# Patient Record
Sex: Female | Born: 1935 | Race: White | Hispanic: No | Smoking: Never smoker
Health system: Southern US, Community
[De-identification: ages and names within clinical notes are randomized; demographics above are authoritative.]

## PROBLEM LIST (undated history)

## (undated) DIAGNOSIS — D649 Anemia, unspecified: Secondary | ICD-10-CM

## (undated) DIAGNOSIS — I10 Essential (primary) hypertension: Secondary | ICD-10-CM

## (undated) DIAGNOSIS — I509 Heart failure, unspecified: Secondary | ICD-10-CM

## (undated) DIAGNOSIS — I4891 Unspecified atrial fibrillation: Secondary | ICD-10-CM

## (undated) HISTORY — PX: EYE SURGERY: SHX253

## (undated) HISTORY — PX: OTHER SURGICAL HISTORY: SHX169

## (undated) HISTORY — PX: FRACTURE SURGERY: SHX138

---

## 2007-06-03 ENCOUNTER — Ambulatory Visit: Payer: Self-pay | Admitting: Cardiology

## 2007-09-10 ENCOUNTER — Ambulatory Visit: Payer: Self-pay | Admitting: Cardiology

## 2011-05-18 ENCOUNTER — Inpatient Hospital Stay (HOSPITAL_COMMUNITY)
Admission: AD | Admit: 2011-05-18 | Discharge: 2011-05-23 | DRG: 481 | Disposition: A | Discharge status: Skilled Nursing Facility | Payer: Medicare Other | Source: Other Acute Inpatient Hospital | Attending: Internal Medicine | Admitting: Internal Medicine

## 2011-05-18 ENCOUNTER — Inpatient Hospital Stay (HOSPITAL_COMMUNITY): Payer: Medicare Other

## 2011-05-18 ENCOUNTER — Encounter (HOSPITAL_COMMUNITY): Payer: Self-pay

## 2011-05-18 DIAGNOSIS — W19XXXA Unspecified fall, initial encounter: Secondary | ICD-10-CM | POA: Diagnosis present

## 2011-05-18 DIAGNOSIS — S72143A Displaced intertrochanteric fracture of unspecified femur, initial encounter for closed fracture: Principal | ICD-10-CM | POA: Diagnosis present

## 2011-05-18 DIAGNOSIS — D649 Anemia, unspecified: Secondary | ICD-10-CM | POA: Clinically undetermined

## 2011-05-18 DIAGNOSIS — I509 Heart failure, unspecified: Secondary | ICD-10-CM | POA: Diagnosis present

## 2011-05-18 DIAGNOSIS — I4891 Unspecified atrial fibrillation: Secondary | ICD-10-CM

## 2011-05-18 DIAGNOSIS — I959 Hypotension, unspecified: Secondary | ICD-10-CM | POA: Diagnosis present

## 2011-05-18 DIAGNOSIS — N189 Chronic kidney disease, unspecified: Secondary | ICD-10-CM | POA: Diagnosis present

## 2011-05-18 DIAGNOSIS — Z7901 Long term (current) use of anticoagulants: Secondary | ICD-10-CM

## 2011-05-18 DIAGNOSIS — N179 Acute kidney failure, unspecified: Secondary | ICD-10-CM

## 2011-05-18 DIAGNOSIS — D62 Acute posthemorrhagic anemia: Secondary | ICD-10-CM | POA: Diagnosis not present

## 2011-05-18 DIAGNOSIS — S72009A Fracture of unspecified part of neck of unspecified femur, initial encounter for closed fracture: Secondary | ICD-10-CM | POA: Diagnosis present

## 2011-05-18 DIAGNOSIS — I129 Hypertensive chronic kidney disease with stage 1 through stage 4 chronic kidney disease, or unspecified chronic kidney disease: Secondary | ICD-10-CM | POA: Diagnosis present

## 2011-05-18 DIAGNOSIS — I10 Essential (primary) hypertension: Secondary | ICD-10-CM | POA: Diagnosis present

## 2011-05-18 HISTORY — DX: Essential (primary) hypertension: I10

## 2011-05-18 HISTORY — DX: Anemia, unspecified: D64.9

## 2011-05-18 HISTORY — DX: Unspecified atrial fibrillation: I48.91

## 2011-05-18 HISTORY — DX: Heart failure, unspecified: I50.9

## 2011-05-18 LAB — COMPREHENSIVE METABOLIC PANEL
AST: 16 U/L (ref 0–37)
BUN: 24 mg/dL — ABNORMAL HIGH (ref 6–23)
CO2: 27 mEq/L (ref 19–32)
Calcium: 9 mg/dL (ref 8.4–10.5)
Chloride: 104 mEq/L (ref 96–112)
Creatinine, Ser: 1.13 mg/dL — ABNORMAL HIGH (ref 0.50–1.10)
GFR calc Af Amer: 45 mL/min — ABNORMAL LOW (ref >90)
GFR calc non Af Amer: 39 mL/min — ABNORMAL LOW (ref >90)
Glucose, Bld: 91 mg/dL (ref 70–99)
Total Bilirubin: 1.2 mg/dL (ref 0.3–1.2)

## 2011-05-18 LAB — CBC
HCT: 31.7 % — ABNORMAL LOW (ref 36.0–46.0)
Hemoglobin: 10.7 g/dL — ABNORMAL LOW (ref 12.0–15.0)
MCHC: 33.8 g/dL (ref 30.0–36.0)
MCV: 98.1 fL (ref 78.0–100.0)

## 2011-05-18 LAB — PHOSPHORUS: Phosphorus: 3.5 mg/dL (ref 2.3–4.6)

## 2011-05-18 LAB — PROTIME-INR: INR: 1.58 — ABNORMAL HIGH (ref 0.00–1.49)

## 2011-05-18 MED ORDER — ACETAMINOPHEN 650 MG RE SUPP: 650.0000 mg | Freq: Four times a day (QID) | RECTAL | Status: DC | PRN

## 2011-05-18 MED ORDER — MORPHINE SULFATE 4 MG/ML IJ SOLN: 0.5000 mg | INTRAMUSCULAR | Status: DC | PRN

## 2011-05-18 MED ORDER — MORPHINE SULFATE 2 MG/ML IJ SOLN: 0.5000 mg | INTRAMUSCULAR | Status: DC | PRN

## 2011-05-18 MED ORDER — HYDROCODONE-ACETAMINOPHEN 5-325 MG PO TABS: 1.0000 | ORAL_TABLET | ORAL | Status: DC | PRN

## 2011-05-18 MED ORDER — ONDANSETRON HCL 4 MG PO TABS: 4.0000 mg | ORAL_TABLET | Freq: Four times a day (QID) | ORAL | Status: DC | PRN

## 2011-05-18 MED ORDER — SENNOSIDES-DOCUSATE SODIUM 8.6-50 MG PO TABS: 1.0000 | ORAL_TABLET | Freq: Every day | ORAL | Status: DC | PRN

## 2011-05-18 MED ORDER — ACETAMINOPHEN 325 MG PO TABS: 650.0000 mg | ORAL_TABLET | Freq: Four times a day (QID) | ORAL | Status: DC | PRN

## 2011-05-18 MED ORDER — HYDROCODONE-ACETAMINOPHEN 5-325 MG PO TABS
1.0000 | ORAL_TABLET | ORAL | Status: DC | PRN
  Administered 2011-05-18: 1 via ORAL
  Filled 2011-05-18: qty 1

## 2011-05-18 MED ORDER — FUROSEMIDE 20 MG PO TABS
20.0000 mg | ORAL_TABLET | Freq: Every day | ORAL | Status: DC
  Administered 2011-05-18: 20 mg via ORAL
  Filled 2011-05-18 (×2): qty 1

## 2011-05-18 MED ORDER — ONDANSETRON HCL 4 MG/2ML IJ SOLN: 4.0000 mg | Freq: Four times a day (QID) | INTRAMUSCULAR | Status: DC | PRN

## 2011-05-18 NOTE — H&P (Addendum)
PCP:  No primary provider on file.   DOA:  05/18/2011  5:52 PM  Chief Complaint:  fall  HPI: Pt presented after fall at home and found to have left hip fracture. Denies chest pain, shortness of breath, no abdominal or urinary concerns.  Allergies: Allergies  Allergen Reactions  . Erythromycin     Reaction and severity  unknown    Prior to Admission medications   Not on File    Past Medical History  Diagnosis Date  . HTN (hypertension) 05/18/2011  . CHF (congestive heart failure) 05/18/2011  . A-fib 05/18/2011    Past Surgical History  Procedure Date  . Eye surgery   . Fracture surgery   . Hx fx lt wrist years ago    Social History:  reports that she has never smoked. She has never used smokeless tobacco. She reports that she does not drink alcohol or use illicit drugs.  No family history on file.  Review of Systems:  Constitutional: Denies fever, chills, diaphoresis, appetite change and fatigue.  HEENT: Denies photophobia, eye pain, redness, hearing loss, ear pain, congestion, sore throat, rhinorrhea, sneezing, mouth sores, trouble swallowing, neck pain, neck stiffness and tinnitus.   Respiratory: Denies SOB, DOE, cough, chest tightness,  and wheezing.   Cardiovascular: Denies chest pain, palpitations and leg swelling.  Gastrointestinal: Denies nausea, vomiting, abdominal pain, diarrhea, constipation, blood in stool and abdominal distention.  Genitourinary: Denies dysuria, urgency, frequency, hematuria, flank pain and difficulty urinating.  Skin: Denies pallor, rash and wound.  Neurological: Denies dizziness, seizures, syncope, weakness, light-headedness, numbness and headaches.  Hematological: Denies adenopathy. Easy bruising, personal or family bleeding history  Psychiatric/Behavioral: Denies suicidal ideation, mood changes, confusion, nervousness, sleep disturbance and agitation   Physical Exam:  Filed Vitals:   05/18/11 1816  BP: 108/67  Pulse: 81    Temp: 98.4 F (36.9 C)  Resp: 20  Height: 5\' 3"  (1.6 m)  Weight: 53.2 kg (117 lb 4.6 oz)  SpO2: 95%    Constitutional: Vital signs reviewed.  Patient is a well-developed and well-nourished in no acute distress and cooperative with exam. Alert and oriented x3.  Head: Normocephalic and atraumatic Ear: TM normal bilaterally Mouth: no erythema or exudates, MMM Eyes: PERRL, EOMI, conjunctivae normal, No scleral icterus.  Neck: Supple, Trachea midline normal ROM, No JVD, mass, thyromegaly, or carotid bruit present.  Cardiovascular: RRR, S1 normal, S2 normal, no MRG, pulses symmetric and intact bilaterally Pulmonary/Chest: CTAB, no wheezes, rales, or rhonchi Abdominal: Soft. Non-tender, non-distended, bowel sounds are normal, no masses, organomegaly, or guarding present.  Ext: no edema and no cyanosis, pulses palpable bilaterally (DP and PT) Hematology: no cervical, inginal, or axillary adenopathy.  Skin: Warm, dry and intact. No rash, cyanosis, or clubbing.  Psychiatric: Normal mood and affect. speech and behavior is normal. Judgment and thought content normal. Cognition and memory are normal.   Labs on Admission:  No results found for this or any previous visit (from the past 48 hour(s)).  Radiological Exams on Admission: No results found.  Assessment/Plan Principal Problem:  *Hip fracture - orthopedic team consulted for further evaluation and management Active Problems:  HTN (hypertension) - continue low dose lasix and reassess in the AM  CHF (congestive heart failure) - home dose is 80 mg BID but will continue 20 mg QD and reassess in the AM  A-fib - pt not on any beta blocker or CCB, she is on coumadin at home but will hold on anticoagulation for now given the need  for likely surgery per ortho   Time Spent on Admission: Over 30 minutes  MAGICK-Tajuan Dufault 05/18/2011, 6:50 PM

## 2011-05-18 NOTE — Progress Notes (Signed)
Donna Saucer, MD went in to see patient.  MD explained surgery to patient.  MD spoke with family member Donna Jones by telephone.  Requested RN to verify consent.   Bartholomew Crews, RN obtained verbal consent as well.  Dahlia Byes RN spoke with patient who verified Donna Jones was one of her two POA's.  She said she was unable to sign the consent (physically).  Dahlia Byes RN spoke with Donna Jones who agreed to bring in [facility] [place] document.

## 2011-05-18 NOTE — Consult Note (Signed)
Reason for Consult left hip pain Referring Physician: Hospitalist physicians as well as [facility] emergency room physicians  Donna Jones is an 77 y.o. female.  HPI: Donna Jones he is a 77 year old embospheres female who walks with a cane she fell today on the day of admission without loss of consciousness and reports left hip pain and now the inability to gain weight she denies any other orthopedic problems. Patient lives in a condominium with [address] care in [place] she has 2 nephews in town to look after her.  Past Medical History  Diagnosis Date  . HTN (hypertension) 05/18/2011  . CHF (congestive heart failure) 05/18/2011  . A-fib 05/18/2011    Past Surgical History  Procedure Date  . Eye surgery   . Fracture surgery   . Hx fx lt wrist years ago    No family history on file.  Social History:  reports that she has never smoked. She has never used smokeless tobacco. She reports that she does not drink alcohol or use illicit drugs.  Allergies:  Allergies  Allergen Reactions  . Erythromycin     Reaction and severity  unknown    Medications: I have reviewed the patient's current medications.  Results for orders placed during the hospital encounter of 05/18/11 (from the past 48 hour(s))  COMPREHENSIVE METABOLIC PANEL     Status: Abnormal   Collection Time   05/18/11  9:00 PM      Component Value Range Comment   Sodium 141  135 - 145 (mEq/L)    Potassium 3.9  3.5 - 5.1 (mEq/L)    Chloride 104  96 - 112 (mEq/L)    CO2 27  19 - 32 (mEq/L)    Glucose, Bld 91  70 - 99 (mg/dL)    BUN 24 (*) 6 - 23 (mg/dL)    Creatinine, Ser 4.78 (*) 0.50 - 1.10 (mg/dL)    Calcium 9.0  8.4 - 10.5 (mg/dL)    Total Protein 5.9 (*) 6.0 - 8.3 (g/dL)    Albumin 3.3 (*) 3.5 - 5.2 (g/dL)    AST 16  0 - 37 (U/L)    ALT 9  0 - 35 (U/L)    Alkaline Phosphatase 55  39 - 117 (U/L)    Total Bilirubin 1.2  0.3 - 1.2 (mg/dL)    GFR calc non Af Amer 39 (*) >90 (mL/min)    GFR calc Af Amer 45 (*) >90 (mL/min)     MAGNESIUM     Status: Normal   Collection Time   05/18/11  9:00 PM      Component Value Range Comment   Magnesium 2.1  1.5 - 2.5 (mg/dL)   PHOSPHORUS     Status: Normal   Collection Time   05/18/11  9:00 PM      Component Value Range Comment   Phosphorus 3.5  2.3 - 4.6 (mg/dL)   APTT     Status: Normal   Collection Time   05/18/11  9:00 PM      Component Value Range Comment   aPTT 30  24 - 37 (seconds)   PROTIME-INR     Status: Abnormal   Collection Time   05/18/11  9:00 PM      Component Value Range Comment   Prothrombin Time 19.2 (*) 11.6 - 15.2 (seconds)    INR 1.58 (*) 0.00 - 1.49    CBC     Status: Abnormal   Collection Time   05/18/11  9:00 PM  Component Value Range Comment   WBC 6.9  4.0 - 10.5 (K/uL)    RBC 3.23 (*) 3.87 - 5.11 (MIL/uL)    Hemoglobin 10.7 (*) 12.0 - 15.0 (g/dL)    HCT 16.1 (*) 09.6 - 46.0 (%)    MCV 98.1  78.0 - 100.0 (fL)    MCH 33.1  26.0 - 34.0 (pg)    MCHC 33.8  30.0 - 36.0 (g/dL)    RDW 04.5  40.9 - 81.1 (%)    Platelets 119 (*) 150 - 400 (K/uL)     No results found.  Review of Systems  Constitutional: Negative.   HENT: Negative.   Eyes: Negative.   Musculoskeletal: Positive for joint pain.  Skin: Positive for rash.  Neurological: Negative.    Blood pressure 97/60, pulse 80, temperature 97.5 F (36.4 C), temperature source Oral, resp. rate 19, height 5\' 3"  (1.6 m), weight 53.2 kg (117 lb 4.6 oz), SpO2 94.00%. Physical Exam on orthopedic examination the patient does have tenderness to palpation on the left hip region she has good dorsiflexion plantarflexion of the feet pedal pulses are palpable sensation is intact in the lower extremities there is no effusion or crepitus in either knee or ankle on the right or left-hand side bilateral upper extremity range of motion is intact extraocular movements are intact the patient answers questions appropriately she has no coarse granular crepitus in the elbow shoulder or wrist joint radial  pulses are intact mentioned patient does have some bruising on the skin she has full range of motion of her neck without pain or tenderness.  Assessment/Plan: Impression: Left intertrochanteric hip fracture outside radiographs are reviewed the patient does have medical comorbidities including atrial fibrillation on Coumadin current INR is 1.5 age as close to being acceptable for surgery. Plan is for intramedullary hip screw fixation the risk and benefits are discussed with the patient in [name] son via phone conversation included but not limited to infection nerve vessel damage incomplete healing as well as the stress of surgery which could give her a cardiovascular event and thrombosis. Hospital stay approximately 2-3 days medical management will be optimized prior to surgery all questions answered regarding the surgery I do plan on I do plan on checking a PT/INR prior to surgery in the morning.  [name] SCOTT 05/18/2011, 11:32 PM

## 2011-05-19 ENCOUNTER — Inpatient Hospital Stay (HOSPITAL_COMMUNITY): Payer: Medicare Other

## 2011-05-19 ENCOUNTER — Encounter (HOSPITAL_COMMUNITY): Payer: Self-pay | Admitting: Internal Medicine

## 2011-05-19 ENCOUNTER — Encounter (HOSPITAL_COMMUNITY): Payer: Self-pay | Admitting: Anesthesiology

## 2011-05-19 ENCOUNTER — Encounter (HOSPITAL_COMMUNITY)
Admission: AD | Disposition: A | Discharge status: Skilled Nursing Facility | Payer: Self-pay | Source: Other Acute Inpatient Hospital | Attending: Internal Medicine

## 2011-05-19 ENCOUNTER — Inpatient Hospital Stay (HOSPITAL_COMMUNITY): Payer: Medicare Other | Admitting: Anesthesiology

## 2011-05-19 ENCOUNTER — Other Ambulatory Visit: Payer: Self-pay

## 2011-05-19 DIAGNOSIS — D649 Anemia, unspecified: Secondary | ICD-10-CM

## 2011-05-19 DIAGNOSIS — N189 Chronic kidney disease, unspecified: Secondary | ICD-10-CM | POA: Diagnosis present

## 2011-05-19 HISTORY — PX: FEMUR IM NAIL: SHX1597

## 2011-05-19 HISTORY — DX: Anemia, unspecified: D64.9

## 2011-05-19 LAB — CBC
HCT: 31.3 % — ABNORMAL LOW (ref 36.0–46.0)
Hemoglobin: 10.5 g/dL — ABNORMAL LOW (ref 12.0–15.0)
MCH: 33.1 pg (ref 26.0–34.0)
MCHC: 33.5 g/dL (ref 30.0–36.0)

## 2011-05-19 LAB — BASIC METABOLIC PANEL
BUN: 24 mg/dL — ABNORMAL HIGH (ref 6–23)
Chloride: 106 mEq/L (ref 96–112)
Creatinine, Ser: 1.2 mg/dL — ABNORMAL HIGH (ref 0.50–1.10)
GFR calc non Af Amer: 36 mL/min — ABNORMAL LOW (ref >90)
Glucose, Bld: 76 mg/dL (ref 70–99)
Potassium: 3.7 mEq/L (ref 3.5–5.1)

## 2011-05-19 LAB — PROTIME-INR: Prothrombin Time: 19.6 seconds — ABNORMAL HIGH (ref 11.6–15.2)

## 2011-05-19 SURGERY — INSERTION, INTRAMEDULLARY ROD, FEMUR
Anesthesia: General | Site: Hip | Laterality: Left | Wound class: Clean

## 2011-05-19 MED ORDER — NEOSTIGMINE METHYLSULFATE 1 MG/ML IJ SOLN
INTRAMUSCULAR | Status: DC | PRN
  Administered 2011-05-19: 3 mg via INTRAVENOUS

## 2011-05-19 MED ORDER — CEFAZOLIN SODIUM 1-5 GM-% IV SOLN
INTRAVENOUS | Status: AC
  Filled 2011-05-19: qty 50

## 2011-05-19 MED ORDER — MAGNESIUM HYDROXIDE 400 MG/5ML PO SUSP: 30.0000 mL | Freq: Two times a day (BID) | ORAL | Status: DC | PRN

## 2011-05-19 MED ORDER — POTASSIUM CHLORIDE CRYS ER 10 MEQ PO TBCR
10.0000 meq | EXTENDED_RELEASE_TABLET | Freq: Every day | ORAL | Status: DC
  Administered 2011-05-19 – 2011-05-23 (×5): 10 meq via ORAL
  Filled 2011-05-19 (×5): qty 1

## 2011-05-19 MED ORDER — GLYCOPYRROLATE 0.2 MG/ML IJ SOLN
INTRAMUSCULAR | Status: DC | PRN
  Administered 2011-05-19: .4 mg via INTRAVENOUS

## 2011-05-19 MED ORDER — MIDAZOLAM HCL 2 MG/2ML IJ SOLN: 0.5000 mg | Freq: Once | INTRAMUSCULAR | Status: DC | PRN

## 2011-05-19 MED ORDER — DOCUSATE SODIUM 100 MG PO CAPS
100.0000 mg | ORAL_CAPSULE | Freq: Two times a day (BID) | ORAL | Status: DC
  Administered 2011-05-19 – 2011-05-23 (×8): 100 mg via ORAL
  Filled 2011-05-19 (×9): qty 1

## 2011-05-19 MED ORDER — SODIUM CHLORIDE 0.9 % IR SOLN
Status: DC | PRN
  Administered 2011-05-19: 1

## 2011-05-19 MED ORDER — BISACODYL 10 MG RE SUPP: 10.0000 mg | Freq: Every day | RECTAL | Status: DC | PRN

## 2011-05-19 MED ORDER — POTASSIUM CHLORIDE IN NACL 20-0.9 MEQ/L-% IV SOLN
INTRAVENOUS | Status: DC
  Administered 2011-05-19: 980 mL via INTRAVENOUS
  Filled 2011-05-19 (×2): qty 1000

## 2011-05-19 MED ORDER — METOCLOPRAMIDE HCL 5 MG/ML IJ SOLN
5.0000 mg | Freq: Three times a day (TID) | INTRAMUSCULAR | Status: DC | PRN
  Filled 2011-05-19: qty 2

## 2011-05-19 MED ORDER — MORPHINE SULFATE 2 MG/ML IJ SOLN: 0.5000 mg | INTRAMUSCULAR | Status: DC | PRN

## 2011-05-19 MED ORDER — POTASSIUM CHLORIDE IN NACL 20-0.9 MEQ/L-% IV SOLN
INTRAVENOUS | Status: DC
  Administered 2011-05-20: 12:00:00 via INTRAVENOUS
  Filled 2011-05-19 (×3): qty 1000

## 2011-05-19 MED ORDER — KCL IN DEXTROSE-NACL 20-5-0.45 MEQ/L-%-% IV SOLN
INTRAVENOUS | Status: DC
  Filled 2011-05-19 (×2): qty 1000

## 2011-05-19 MED ORDER — MEPERIDINE HCL 25 MG/ML IJ SOLN: 6.2500 mg | INTRAMUSCULAR | Status: DC | PRN

## 2011-05-19 MED ORDER — MENTHOL 3 MG MT LOZG
1.0000 | LOZENGE | OROMUCOSAL | Status: DC | PRN
  Filled 2011-05-19: qty 9

## 2011-05-19 MED ORDER — METOCLOPRAMIDE HCL 10 MG PO TABS: 5.0000 mg | ORAL_TABLET | Freq: Three times a day (TID) | ORAL | Status: DC | PRN

## 2011-05-19 MED ORDER — FLEET ENEMA 7-19 GM/118ML RE ENEM
1.0000 | ENEMA | Freq: Every day | RECTAL | Status: DC | PRN
  Filled 2011-05-19: qty 1

## 2011-05-19 MED ORDER — ETOMIDATE 2 MG/ML IV SOLN
INTRAVENOUS | Status: DC | PRN
  Administered 2011-05-19: 14 mg via INTRAVENOUS

## 2011-05-19 MED ORDER — METHOCARBAMOL 500 MG PO TABS
500.0000 mg | ORAL_TABLET | Freq: Four times a day (QID) | ORAL | Status: DC | PRN
  Filled 2011-05-19: qty 1

## 2011-05-19 MED ORDER — PHENOL 1.4 % MT LIQD
1.0000 | OROMUCOSAL | Status: DC | PRN
  Filled 2011-05-19: qty 177

## 2011-05-19 MED ORDER — WARFARIN SODIUM 2.5 MG PO TABS: 2.5000 mg | ORAL_TABLET | Freq: Every day | ORAL | Status: DC

## 2011-05-19 MED ORDER — CEFAZOLIN SODIUM 1-5 GM-% IV SOLN
INTRAVENOUS | Status: DC | PRN
  Administered 2011-05-19: 1 g via INTRAVENOUS

## 2011-05-19 MED ORDER — HYDROMORPHONE HCL PF 1 MG/ML IJ SOLN: 0.2500 mg | INTRAMUSCULAR | Status: DC | PRN

## 2011-05-19 MED ORDER — ACETAMINOPHEN 325 MG PO TABS
650.0000 mg | ORAL_TABLET | Freq: Four times a day (QID) | ORAL | Status: DC | PRN
  Administered 2011-05-22: 650 mg via ORAL
  Filled 2011-05-19: qty 2

## 2011-05-19 MED ORDER — WARFARIN SODIUM 2.5 MG PO TABS
2.5000 mg | ORAL_TABLET | Freq: Once | ORAL | Status: AC
  Administered 2011-05-19: 2.5 mg via ORAL
  Filled 2011-05-19: qty 1

## 2011-05-19 MED ORDER — ACETAMINOPHEN 650 MG RE SUPP: 650.0000 mg | Freq: Four times a day (QID) | RECTAL | Status: DC | PRN

## 2011-05-19 MED ORDER — PROMETHAZINE HCL 25 MG/ML IJ SOLN: 6.2500 mg | INTRAMUSCULAR | Status: DC | PRN

## 2011-05-19 MED ORDER — FUROSEMIDE 80 MG PO TABS
80.0000 mg | ORAL_TABLET | Freq: Two times a day (BID) | ORAL | Status: DC
  Filled 2011-05-19 (×2): qty 1

## 2011-05-19 MED ORDER — METHOCARBAMOL 100 MG/ML IJ SOLN
500.0000 mg | Freq: Four times a day (QID) | INTRAVENOUS | Status: DC | PRN
  Filled 2011-05-19: qty 5

## 2011-05-19 MED ORDER — OXYBUTYNIN CHLORIDE 5 MG PO TABS
5.0000 mg | ORAL_TABLET | Freq: Two times a day (BID) | ORAL | Status: DC
  Administered 2011-05-19 – 2011-05-23 (×8): 5 mg via ORAL
  Filled 2011-05-19 (×9): qty 1

## 2011-05-19 MED ORDER — LACTATED RINGERS IV SOLN: INTRAVENOUS | Status: DC

## 2011-05-19 MED ORDER — HYDROCODONE-ACETAMINOPHEN 5-325 MG PO TABS
1.0000 | ORAL_TABLET | ORAL | Status: DC | PRN
  Administered 2011-05-21 – 2011-05-23 (×2): 1 via ORAL
  Filled 2011-05-19 (×2): qty 1

## 2011-05-19 MED ORDER — BISACODYL 5 MG PO TBEC: 10.0000 mg | DELAYED_RELEASE_TABLET | Freq: Every day | ORAL | Status: DC | PRN

## 2011-05-19 MED ORDER — FENTANYL CITRATE 0.05 MG/ML IJ SOLN
INTRAMUSCULAR | Status: DC | PRN
  Administered 2011-05-19 (×2): 50 ug via INTRAVENOUS

## 2011-05-19 MED ORDER — ROCURONIUM BROMIDE 100 MG/10ML IV SOLN
INTRAVENOUS | Status: DC | PRN
  Administered 2011-05-19: 40 mg via INTRAVENOUS

## 2011-05-19 MED ORDER — LACTATED RINGERS IV SOLN
INTRAVENOUS | Status: DC | PRN
  Administered 2011-05-19: 07:00:00 via INTRAVENOUS

## 2011-05-19 MED ORDER — POLYETHYLENE GLYCOL 3350 17 G PO PACK
17.0000 g | PACK | Freq: Every day | ORAL | Status: DC | PRN
  Filled 2011-05-19: qty 1

## 2011-05-19 MED ORDER — CEFAZOLIN SODIUM 1-5 GM-% IV SOLN
1.0000 g | Freq: Four times a day (QID) | INTRAVENOUS | Status: AC
  Administered 2011-05-19 – 2011-05-20 (×3): 1 g via INTRAVENOUS
  Filled 2011-05-19 (×4): qty 50

## 2011-05-19 SURGICAL SUPPLY — 46 items
BLADE SURG 15 STRL LF DISP TIS (BLADE) ×1 IMPLANT
BLADE SURG 15 STRL SS (BLADE) ×1
BLADE SURG ROTATE 9660 (MISCELLANEOUS) IMPLANT
CLOTH BEACON ORANGE TIMEOUT ST (SAFETY) ×2 IMPLANT
COVER SURGICAL LIGHT HANDLE (MISCELLANEOUS) ×2 IMPLANT
DRAPE ORTHO SPLIT 77X108 STRL (DRAPES)
DRAPE PROXIMA HALF (DRAPES) IMPLANT
DRAPE STERI IOBAN 125X83 (DRAPES) ×2 IMPLANT
DRAPE SURG ORHT 6 SPLT 77X108 (DRAPES) IMPLANT
DRSG MEPILEX BORDER 4X4 (GAUZE/BANDAGES/DRESSINGS) ×6 IMPLANT
DRSG MEPILEX BORDER 4X8 (GAUZE/BANDAGES/DRESSINGS) IMPLANT
DRSG XEROFORM 1X8 (GAUZE/BANDAGES/DRESSINGS) ×4 IMPLANT
DURAPREP 26ML APPLICATOR (WOUND CARE) ×2 IMPLANT
ELECT REM PT RETURN 9FT ADLT (ELECTROSURGICAL) ×2
ELECTRODE REM PT RTRN 9FT ADLT (ELECTROSURGICAL) ×1 IMPLANT
FACESHIELD LNG OPTICON STERILE (SAFETY) IMPLANT
GAUZE XEROFORM 1X8 LF (GAUZE/BANDAGES/DRESSINGS) ×2 IMPLANT
GAUZE XEROFORM 5X9 LF (GAUZE/BANDAGES/DRESSINGS) ×2 IMPLANT
GLOVE BIOGEL PI IND STRL 8 (GLOVE) ×1 IMPLANT
GLOVE BIOGEL PI INDICATOR 8 (GLOVE) ×1
GLOVE SURG ORTHO 8.0 STRL STRW (GLOVE) ×2 IMPLANT
GOWN PREVENTION PLUS LG XLONG (DISPOSABLE) IMPLANT
GOWN PREVENTION PLUS XLARGE (GOWN DISPOSABLE) ×2 IMPLANT
GOWN STRL NON-REIN LRG LVL3 (GOWN DISPOSABLE) ×2 IMPLANT
GUIDE PIN 3.2 LONG (PIN) ×2 IMPLANT
GUIDE PIN 3.2MM (MISCELLANEOUS) ×1
GUIDE PIN ORTH 343X3.2XBRAD (MISCELLANEOUS) ×1 IMPLANT
HIP SCREW SET (Screw) ×2 IMPLANT
KIT BASIN OR (CUSTOM PROCEDURE TRAY) ×2 IMPLANT
KIT ROOM TURNOVER OR (KITS) ×2 IMPLANT
MANIFOLD NEPTUNE II (INSTRUMENTS) ×2 IMPLANT
NAIL IMSH 10X34 (Nail) ×2 IMPLANT
NS IRRIG 1000ML POUR BTL (IV SOLUTION) ×2 IMPLANT
PACK GENERAL/GYN (CUSTOM PROCEDURE TRAY) ×2 IMPLANT
PAD ARMBOARD 7.5X6 YLW CONV (MISCELLANEOUS) ×4 IMPLANT
SCREW COMPRESSION (Screw) ×2 IMPLANT
SCREW LAG 90MM (Screw) ×1 IMPLANT
SCREW LAGSTD 90X21X12.7X9 (Screw) ×1 IMPLANT
SPONGE LAP 4X18 X RAY DECT (DISPOSABLE) IMPLANT
STAPLER VISISTAT 35W (STAPLE) ×2 IMPLANT
SUT ETHILON 2 0 FS 18 (SUTURE) IMPLANT
SUT VIC AB 2-0 CTB1 (SUTURE) ×4 IMPLANT
TAPE STRIPS DRAPE STRL (GAUZE/BANDAGES/DRESSINGS) IMPLANT
TOWEL OR 17X24 6PK STRL BLUE (TOWEL DISPOSABLE) ×2 IMPLANT
TOWEL OR 17X26 10 PK STRL BLUE (TOWEL DISPOSABLE) ×2 IMPLANT
WATER STERILE IRR 1000ML POUR (IV SOLUTION) ×2 IMPLANT

## 2011-05-19 NOTE — Transfer of Care (Signed)
Immediate Anesthesia Transfer of Care Note  Patient: Donna Jones  Procedure(s) Performed:  INTRAMEDULLARY (IM) NAIL FEMORAL  Patient Location: PACU  Anesthesia Type: General  Level of Consciousness: awake and alert   Airway & Oxygen Therapy: Patient Spontanous Breathing and Patient connected to face mask oxygen  Post-op Assessment: Report given to PACU RN  Post vital signs: Reviewed and stable  Complications: No apparent anesthesia complications

## 2011-05-19 NOTE — Progress Notes (Signed)
Subjective: No events overnight. Pt is back from surgery and reports feeling well, tolerated surgery well.  Objective:  Vital signs in last 24 hours:  Filed Vitals:   05/19/11 1030 05/19/11 1031 05/19/11 1032 05/19/11 1033  BP:      Pulse: 52 104 116 103  Temp:      TempSrc:      Resp: 15 16 17 15   Height:      Weight:      SpO2: 98% 98% 99% 98%    Intake/Output from previous day:   Intake/Output Summary (Last 24 hours) at 05/19/11 1244 Last data filed at 05/19/11 1015  Gross per 24 hour  Intake    782 ml  Output    500 ml  Net    282 ml    Physical Exam: General: Alert, awake, oriented x3, in no acute distress. HEENT: No bruits, no goiter. Heart: Irregular rate and rhythm, without murmurs, rubs, gallops. Lungs: Clear to auscultation bilaterally. Abdomen: Soft, nontender, nondistended, positive bowel sounds. Extremities: No clubbing cyanosis or edema with positive pedal pulses. Neuro: Grossly intact, nonfocal.   Lab Results:  Basic Metabolic Panel:    Component Value Date/Time   NA 144 05/19/2011 0540   K 3.7 05/19/2011 0540   CL 106 05/19/2011 0540   CO2 29 05/19/2011 0540   BUN 24* 05/19/2011 0540   CREATININE 1.20* 05/19/2011 0540   GLUCOSE 76 05/19/2011 0540   CALCIUM 8.9 05/19/2011 0540   CBC:    Component Value Date/Time   WBC 6.5 05/19/2011 0540   HGB 10.5* 05/19/2011 0540   HCT 31.3* 05/19/2011 0540   PLT 116* 05/19/2011 0540   MCV 98.7 05/19/2011 0540    No results found for this or any previous visit (from the past 240 hour(s)).  Studies/Results: Dg Chest 2 View  05/19/2011  *RADIOLOGY REPORT*  Clinical Data: Preoperative chest radiograph for left femoral intertrochanteric fracture.  CHEST - 2 VIEW  Comparison: Chest radiograph performed 05/18/2011  Findings: The lungs are well-aerated.  Minimal bilateral atelectasis or scarring is noted.  Mild peripheral nodularity appears stable from prior studies.  There is no evidence of pleural  effusion or pneumothorax.  The heart is mildly enlarged; the patient is status post median sternotomy.  No acute osseous abnormalities are seen.  IMPRESSION:  1.  Minimal bilateral atelectasis or scarring noted; mild chronic lung changes seen. 2.  Mild cardiomegaly.  Original Report Authenticated By: Tonia Ghent, M.D.    Medications: Scheduled Meds:   . DISCONTD: furosemide  20 mg Oral Daily   Continuous Infusions:   . DISCONTD: 0.9 % NaCl with KCl 20 mEq / L 75 mL/hr at 05/19/11 0530  . DISCONTD: dextrose 5 % and 0.45 % NaCl with KCl 20 mEq/L    . DISCONTD: lactated ringers     PRN Meds:.acetaminophen, acetaminophen, HYDROcodone-acetaminophen, methocarbamol(ROBAXIN) IV, methocarbamol, morphine injection, ondansetron (ZOFRAN) IV, ondansetron, senna-docusate, DISCONTD: HYDROcodone-acetaminophen, DISCONTD: HYDROmorphone, DISCONTD: meperidine, DISCONTD: midazolam, DISCONTD: midazolam, DISCONTD: morphine, DISCONTD:  morphine injection, DISCONTD: promethazine, DISCONTD: sodium chloride  Assessment/Plan:  Principal Problem:  *Hip fracture, intertrochanteric - per orthopedic team, pt has tolerated surgery well  Active Problems:  HTN (hypertension) - currently well controlled  CHF (congestive heart failure) - clinically compensated. We have temporarily held lasix prior to surgery and now since the is ? New elevation in creatining I will continue to hold it for another 24 hours since pt also appears slightly hypovolemic on physical exam. Please note that pt is on Lasix  80 mg BID at home.   A-fib - currently rate controlled, resume coumadin   Renal failure (ARF), acute on chronic - follow strict I's and O's, obtain BMP in the AM   Anemia - unclear what the pt's baseline is, this could be related to post op blood loss but for now will repeat CBC in AM    LOS: 1 day   Donna Jones 05/19/2011, 12:44 PM

## 2011-05-19 NOTE — Progress Notes (Signed)
05/19/11  1050 pt returned from PACU via hospital bed, awake, alert, oriented to self.  Family here, allowed to visit, place back on tele # 24 with a fib (chronic). PAS hose applied, cont pulse ox applied with 95% sats on 2lpm.  IV flds with K+ restarted to continue this bag and d/c as long as taking flds and no nausea, call MD as needed regarding this.  Dsg left upper leg dry and intact, ice bag in place.  Pt states no pain.  VS stable.

## 2011-05-19 NOTE — Brief Op Note (Signed)
05/18/2011 - 05/19/2011  9:20 AM  PATIENT:  Donna Jones  77 y.o. female  PRE-OPERATIVE DIAGNOSIS:  Left hip fracture  POST-OPERATIVE DIAGNOSIS: left hip fracture*  PROCEDURE:  Procedure(s): INTRAMEDULLARY (IM) NAIL FEMORAL  SURGEON:  Surgeon(s): Cammy Copa  ASSISTANT:   ANESTHESIA:   general  EBL: 50 ml    Total I/O In: 682 [I.V.:350; Blood:332] Out: 50 [Blood:50]  BLOOD ADMINISTERED: none  DRAINS: none   LOCAL MEDICATIONS USED:  none  SPECIMEN:  No Specimen  COUNTS:  YES  TOURNIQUET:  * No tourniquets in log *  DICTATION: .Other Dictation: Dictation Number dictated in system  PLAN OF CARE: Admit to inpatient   PATIENT DISPOSITION:  PACU - hemodynamically stable

## 2011-05-19 NOTE — Anesthesia Procedure Notes (Signed)
Procedure Name: Intubation Date/Time: 05/19/2011 8:13 AM Performed by: Glendora Score Pre-anesthesia Checklist: Patient identified, Emergency Drugs available, Suction available and Patient being monitored Patient Re-evaluated:Patient Re-evaluated prior to inductionOxygen Delivery Method: Circle System Utilized Preoxygenation: Pre-oxygenation with 100% oxygen Intubation Type: IV induction Ventilation: Mask ventilation without difficulty Laryngoscope Size: Miller and 2 Grade View: Grade I Tube type: Oral Tube size: 7.5 mm Placement Confirmation: ETT inserted through vocal cords under direct vision,  positive ETCO2 and breath sounds checked- equal and bilateral Secured at: 21 cm Tube secured with: Tape Dental Injury: Teeth and Oropharynx as per pre-operative assessment

## 2011-05-19 NOTE — Op Note (Signed)
NAMEJANACE, DECKER NO.:  [id number]  MEDICAL RECORD NO.:  [id number]  LOCATION:  [facility]                         FACILITY:  [facility]  PHYSICIAN:  Burnard Bunting, M.D.    DATE OF BIRTH:  June 02, 1911  DATE OF PROCEDURE:  05/19/2011 DATE OF DISCHARGE:                              OPERATIVE REPORT   PREOPERATIVE DIAGNOSIS:  Left hip intertrochanteric fracture.  POSTOPERATIVE DIAGNOSIS:  Left hip intertrochanteric fracture.  PROCEDURE:  Left hip intertrochanteric fracture, open reduction and internal fixation with Smith and Nephew, left IMHS 10 x 34 with 90 mm lag screw.  SURGEON:  Burnard Bunting, MD  ASSISTANT:  None.  ANESTHESIA:  General endotracheal.  ESTIMATED BLOOD LOSS:  50 mL.  DRAINS:  None.  INDICATIONS:  Caliegh Middlekauff is an ambulatory 77 year old female with left hip pain and fracture following a fall.  She presents now for operative management of her hip fracture after explanation risks and benefits.  PROCEDURE IN DETAIL:  The patient was brought to the operating room where general endotracheal anesthesia was induced.  Preoperative antibiotics were administered.  Time-out was called.  FFP was administered for INR of 1.6.  Ioban wall drape was used to cover the operative field after it was prepped with alcohol and Betadine which was allowed to air dry and then prepped with DuraPrep solution.  Topographical anatomy was marked including aversion of the femur and the femoral neck and location of the trochanter.  Incision was made 4 fingerbreadths proximal to the trochanteric tip. Guide pin was inserted. Mid portion of the trochanter confirmed the AP and lateral planes under fluoroscopy.  Proximal reaming was performed 10 x 34 nail in accordance with preoperative thin plating was placed.  Through a separate incision, a 90 mm lag screw was placed followed by compression screw.  This gave excellent fixation of the fracture.  Lag screw was placed in the  middle inferior portion of the neck in the most high quality bone.  Good fixation was achieved.  Both incisions were then thoroughly irrigated and closed using a 0 Vicryl suture, 2-0 Vicryl suture, and skin staples.  The patient was then extubated.  She tolerated the procedure well without immediate complications.     Burnard Bunting, M.D.     GSD/MEDQ  D:  05/19/2011  T:  05/19/2011  Job:  [id number]

## 2011-05-19 NOTE — Anesthesia Postprocedure Evaluation (Signed)
  Anesthesia Post-op Note  Patient: Donna Jones  Procedure(s) Performed:  INTRAMEDULLARY (IM) NAIL FEMORAL  Patient Location: PACU  Anesthesia Type: General  Level of Consciousness: awake  Airway and Oxygen Therapy: Patient Spontanous Breathing  Post-op Pain: mild  Post-op Assessment: Post-op Vital signs reviewed  Post-op Vital Signs: stable  Complications: No apparent anesthesia complications

## 2011-05-19 NOTE — Anesthesia Preprocedure Evaluation (Addendum)
Anesthesia Evaluation  Patient identified by MRN, date of birth, ID band Patient awake    Reviewed: Allergy & Precautions, H&P , NPO status , Patient's Chart, lab work & pertinent test results  History of Anesthesia Complications Negative for: history of anesthetic complications  Airway Mallampati: II TM Distance: <3 FB Neck ROM: Full    Dental  (+) Teeth Intact and Dental Advisory Given   Pulmonary  clear to auscultation        Cardiovascular Exercise Tolerance: Poor hypertension, Pt. on medications + CAD and +CHF Irregular Abnormal AFib; chronic anticoagulation.   Neuro/Psych Short term memory issues    GI/Hepatic   Endo/Other    Renal/GU      Musculoskeletal   Abdominal   Peds  Hematology Last dose coumadin 05/17/11. INR 1.6. Platelets 116000.   Anesthesia Other Findings   Reproductive/Obstetrics                          Anesthesia Physical Anesthesia Plan  ASA: III  Anesthesia Plan: General   Post-op Pain Management:    Induction: Intravenous  Airway Management Planned: Oral ETT  Additional Equipment:   Intra-op Plan:   Post-operative Plan: Possible Post-op intubation/ventilation  Informed Consent: I have reviewed the patients History and Physical, chart, labs and discussed the procedure including the risks, benefits and alternatives for the proposed anesthesia with the patient or authorized representative who has indicated his/her understanding and acceptance.   Dental advisory given  Plan Discussed with:   Anesthesia Plan Comments:         Anesthesia Quick Evaluation

## 2011-05-19 NOTE — Progress Notes (Signed)
ANTICOAGULATION CONSULT NOTE - Initial Consult  Pharmacy Consult for Coumadin Indication: atrial fibrillation, DVT prophylaxis s/p hip ORIF  Allergies  Allergen Reactions  . Erythromycin     Reaction and severity  unknown    Patient Measurements: Height: 5\' 3"  (160 cm) Weight: 117 lb 4.6 oz (53.2 kg) IBW/kg (Calculated) : 52.4   Vital Signs: Temp: 97.6 F (36.4 C) (11/11 1015) Temp src: Oral (11/11 0505) BP: 100/67 mmHg (11/11 1028) Pulse Rate: 103  (11/11 1033)  Labs:  [facility] 05/19/11 0540 05/18/11 2100  HGB 10.5* 10.7*  HCT 31.3* 31.7*  PLT 116* 119*  APTT -- 30  LABPROT 19.6* 19.2*  INR 1.63* 1.58*  HEPARINUNFRC -- --  CREATININE 1.20* 1.13*  CKTOTAL -- --  CKMB -- --  TROPONINI -- --   Estimated Creatinine Clearance: 21.7 ml/min (by C-G formula based on Cr of 1.2).  Medical History: Past Medical History  Diagnosis Date  . HTN (hypertension) 05/18/2011  . CHF (congestive heart failure) 05/18/2011  . A-fib 05/18/2011  . Anemia 05/19/2011    Medications:  Prescriptions prior to admission  Medication Sig Dispense Refill  . furosemide (LASIX) 80 MG tablet Take 80 mg by mouth 2 (two) times daily.        Marland Kitchen oxybutynin (DITROPAN) 5 MG tablet Take 5 mg by mouth 2 (two) times daily.        . potassium chloride (KLOR-CON) 10 MEQ CR tablet Take 10 mEq by mouth 2 (two) times daily.        Marland Kitchen warfarin (COUMADIN) 2.5 MG tablet Take 2.5 mg by mouth at bedtime.          Assessment: Afib, s/p hip IM Nailing: to restart anticoagulation with Coumadin.  Note her INR was subtherapeutic on admit but given postoperative state, anemia, and advanced age I will not increase her initial dosage.  Goal of Therapy:  INR 2-3   Plan:  Coumadin 2.5mg  tonight. Daily PT/INR.  Madolyn Frieze 05/19/2011,1:00 PM

## 2011-05-19 NOTE — Preoperative (Signed)
Beta Blockers   Reason not to administer Beta Blockers:Not Applicable 

## 2011-05-19 NOTE — Progress Notes (Signed)
05/19/11 0155 Contacted Rise Paganini, MD regarding clarification of pre op orders.  Pre op orders continuous pulse ox, steward sedation, neuro checks, ice and D5 1/2 NS with 20K discontinued per verbal on telephone pre operative.

## 2011-05-20 LAB — PREPARE FRESH FROZEN PLASMA: Unit division: 0

## 2011-05-20 LAB — PROTIME-INR: Prothrombin Time: 21.9 seconds — ABNORMAL HIGH (ref 11.6–15.2)

## 2011-05-20 MED ORDER — SIMVASTATIN 20 MG PO TABS
20.0000 mg | ORAL_TABLET | Freq: Every day | ORAL | Status: DC
  Administered 2011-05-20 – 2011-05-22 (×3): 20 mg via ORAL
  Filled 2011-05-20 (×4): qty 1

## 2011-05-20 MED ORDER — LIP MEDEX EX OINT
TOPICAL_OINTMENT | CUTANEOUS | Status: DC | PRN
  Administered 2011-05-20: 1 via TOPICAL
  Filled 2011-05-20: qty 7

## 2011-05-20 MED ORDER — HYPROMELLOSE (GONIOSCOPIC) 2.5 % OP SOLN
1.0000 [drp] | Freq: Three times a day (TID) | OPHTHALMIC | Status: DC
  Filled 2011-05-20: qty 15

## 2011-05-20 MED ORDER — SODIUM CHLORIDE 0.9 % IV SOLN
INTRAVENOUS | Status: DC
  Administered 2011-05-20: 16:00:00 via INTRAVENOUS

## 2011-05-20 MED ORDER — FUROSEMIDE 40 MG PO TABS
40.0000 mg | ORAL_TABLET | Freq: Two times a day (BID) | ORAL | Status: DC
  Administered 2011-05-20 – 2011-05-23 (×6): 40 mg via ORAL
  Filled 2011-05-20 (×9): qty 1

## 2011-05-20 MED ORDER — POLYVINYL ALCOHOL 1.4 % OP SOLN
1.0000 [drp] | Freq: Three times a day (TID) | OPHTHALMIC | Status: DC
  Administered 2011-05-20 – 2011-05-23 (×8): 1 [drp] via OPHTHALMIC
  Filled 2011-05-20 (×2): qty 15

## 2011-05-20 MED ORDER — WARFARIN SODIUM 2.5 MG PO TABS
2.5000 mg | ORAL_TABLET | Freq: Once | ORAL | Status: AC
  Administered 2011-05-20: 2.5 mg via ORAL
  Filled 2011-05-20: qty 1

## 2011-05-20 NOTE — Progress Notes (Cosign Needed)
Subjective: No events overnight.   Objective:  Vital signs in last 24 hours:  Filed Vitals:   05/19/11 1425 05/19/11 2128 05/20/11 0548 05/20/11 0550  BP: 91/56 87/56 97/60    Pulse: 94 113 96   Temp: 97.6 F (36.4 C) 100.3 F (37.9 C) 98.5 F (36.9 C)   TempSrc: Oral     Resp: 18 20 20    Height:      Weight:      SpO2: 100% 97% 91% 95%    Intake/Output from previous day:  Intake/Output Summary (Last 24 hours) at 05/20/11 1002 Last data filed at 05/20/11 0906  Gross per 24 hour  Intake   1400 ml  Output    900 ml  Net    500 ml    Physical Exam: [name] is awake and alert she appears frail and elderly. Head is atraumatic normocephalic. Chest demonstrates no accessory muscle use no wheezes or crackles to my exam Heart has an irregularly irregular rhythm patient has chronic atrial fibrillation. I hear no murmurs rubs or gallops Abdomen has active bowel sounds soft nontender nondistended Lower extremities demonstrate multiple bruises, she has no distal extremity swelling  Lab Results:  Basic Metabolic Panel:    Component Value Date/Time   NA 144 05/19/2011 0540   K 3.7 05/19/2011 0540   CL 106 05/19/2011 0540   CO2 29 05/19/2011 0540   BUN 24* 05/19/2011 0540   CREATININE 1.20* 05/19/2011 0540   GLUCOSE 76 05/19/2011 0540   CALCIUM 8.9 05/19/2011 0540   CBC:    Component Value Date/Time   WBC 6.5 05/19/2011 0540   HGB 10.5* 05/19/2011 0540   HCT 31.3* 05/19/2011 0540   PLT 116* 05/19/2011 0540   MCV 98.7 05/19/2011 0540    No results found for this or any previous visit (from the past 240 hour(s)).  Studies/Results: Dg Chest 2 View  05/19/2011  *RADIOLOGY REPORT*  Clinical Data: Preoperative chest radiograph for left femoral intertrochanteric fracture.  CHEST - 2 VIEW  Comparison: Chest radiograph performed 05/18/2011  Findings: The lungs are well-aerated.  Minimal bilateral atelectasis or scarring is noted.  Mild peripheral nodularity  appears stable from prior studies.  There is no evidence of pleural effusion or pneumothorax.  The heart is mildly enlarged; the patient is status post median sternotomy.  No acute osseous abnormalities are seen.  IMPRESSION:  1.  Minimal bilateral atelectasis or scarring noted; mild chronic lung changes seen. 2.  Mild cardiomegaly.  Original Report Authenticated By: Tonia Ghent, M.D.   Dg Femur Left  05/19/2011  *RADIOLOGY REPORT*  Clinical Data: Left hip fracture.  LEFT FEMUR - 2 VIEW  Comparison: Radiographs dated 05/18/2011  Findings: AP and lateral C-arm images demonstrate insertion of an intramedullary rod in the femoral shaft and a compression screw across the intertrochanteric fracture of the proximal left femur. Position of the fragments is anatomic.  IMPRESSION: Satisfactory appearance of the left hip after open reduction and internal fixation of intertrochanteric fracture.  Original Report Authenticated By: Gwynn Burly, M.D.   Dg C-arm 61-120 Min  05/19/2011  CLINICAL DATA: fracture left femur   C-ARM 61-120 MINUTES  Fluoroscopy was utilized by the requesting physician.  No radiographic  interpretation.      Medications: Scheduled Meds:   . ceFAZolin (ANCEF) IV  1 g Intravenous Q6H  . docusate sodium  100 mg Oral BID  . furosemide  80 mg Oral BID  . oxybutynin  5 mg Oral BID  .  potassium chloride  10 mEq Oral Daily  . warfarin  2.5 mg Oral ONCE-1800  . DISCONTD: warfarin  2.5 mg Oral QHS   Continuous Infusions:   . 0.9 % NaCl with KCl 20 mEq / L    . DISCONTD: 0.9 % NaCl with KCl 20 mEq / L 75 mL/hr at 05/19/11 0530  . DISCONTD: lactated ringers     PRN Meds:.acetaminophen, acetaminophen, bisacodyl, bisacodyl, HYDROcodone-acetaminophen, lip balm, magnesium hydroxide, menthol-cetylpyridinium, metoCLOPramide (REGLAN) injection, metoCLOPramide, morphine injection, ondansetron (ZOFRAN) IV, ondansetron, phenol, polyethylene glycol, senna-docusate, sodium phosphate, DISCONTD:  acetaminophen, DISCONTD: acetaminophen, DISCONTD: HYDROcodone-acetaminophen, DISCONTD: HYDROmorphone, DISCONTD: meperidine DISCONTD: methocarbamol(ROBAXIN) IV, DISCONTD: methocarbamol, DISCONTD: midazolam, DISCONTD: midazolam, DISCONTD: promethazine  Assessment/Plan:  Principal Problem:  *Hip fracture, intertrochanteric - S/P Surgery  Managed by ortho.  PT working with patient today.    Active Problems:  HTN (hypertension) - currently hypotensive.  Not on BP meds.  Will decrease lasix, and add Tylenol to help reduce narcotic use.  CHF (congestive heart failure) - stable on lasix.  A-fib - chronic, rate controlled.  Renal failure (ARF), acute on chronic - appears stable creatinine at 1.2  Anemia - will monitor closely post op.  Patient has many bruises.    LOS: 2 days   YORK,Kaulana Brindle L 05/20/2011, 10:02 AM

## 2011-05-20 NOTE — Progress Notes (Signed)
Subjective: Pt l hip feels better   Objective: Vital signs in last 24 hours: Temp:  [97.6 F (36.4 C)-100.3 F (37.9 C)] 98.5 F (36.9 C) (11/12 0548) Pulse Rate:  [94-113] 100  (11/12 1000) Resp:  [18-20] 20  (11/12 0548) BP: (87-97)/(56-60) 97/60 mmHg (11/12 0548) SpO2:  [91 %-100 %] 97 % (11/12 1000)  Intake/Output from previous day: 11/11 0701 - 11/12 0700 In: 1912 [P.O.:180; I.V.:1350; Blood:332; IV Piggyback:50] Out: 950 [Urine:900; Blood:50] Intake/Output this shift: Total I/O In: 170 [P.O.:120; IV Piggyback:50] Out: -   Exam:  Sensation intact distally Dorsiflexion/Plantar flexion intact Incision: no drainage  Labs:  [facility] 05/19/11 0540 05/18/11 2100  HGB 10.5* 10.7*    Basename 05/19/11 0540 05/18/11 2100  WBC 6.5 6.9  RBC 3.17* 3.23*  HCT 31.3* 31.7*  PLT 116* 119*    Basename 05/19/11 0540 05/18/11 2100  NA 144 141  K 3.7 3.9  CL 106 104  CO2 29 27  BUN 24* 24*  CREATININE 1.20* 1.13*  GLUCOSE 76 91  CALCIUM 8.9 9.0    Basename 05/19/11 0540 05/18/11 2100  LABPT -- --  INR 1.63* 1.58*    Assessment/Plan: Mobilize with pt - ready for transfer to snf from ortho perspective will need f/u 10 days   Donna Jones 05/20/2011, 12:40 PM

## 2011-05-20 NOTE — Progress Notes (Signed)
Clinical social work completed patient psychosocial assessment, see pt shadow chart. Pt is open to short term rehab at skilled nursing facility with the hopes of returning home where 24 hour private care is available. Patient requested clinical social worker to contact patient niece to assist with dc plans to skilled nursing. Pt family requested [facility] or [facility] for to pt to dc to when medically stable. Clinical social worker initiated skilled nursing search, see placement note in pt shadow chart.    Catha Gosselin, [place]   [phone]   17:52

## 2011-05-20 NOTE — Progress Notes (Signed)
05/18/11  @ 1800  Pt arrived by [facility] from [facility], taken there by family (nephew & his wife, pt does not have any other family).  Alert, oriented, IV NSL rt a/c. Bruising, thin skin typical of Coumadin therapy with larger bruise to left elbow & skin tear to left knee.  TH admitting MD has called ortho surgeon for consult, will see pt tonight with possible surgery in am.  Orientation to room/dept, safety video viewed.  Full Code, waiting on additional orders.  Bonney Leitz RN

## 2011-05-20 NOTE — Progress Notes (Signed)
Physical Therapy Evaluation Patient Details Name: Donna Jones MRN: [id number] DOB: 12-20-1910 Today's Date: 05/20/2011  Problem List:  Patient Active Problem List  Diagnoses  . Hip fracture  . HTN (hypertension)  . CHF (congestive heart failure)  . A-fib  . Hip fracture, intertrochanteric  . Renal failure (ARF), acute on chronic  . Anemia    Past Medical History:  Past Medical History  Diagnosis Date  . HTN (hypertension) 05/18/2011  . CHF (congestive heart failure) 05/18/2011  . A-fib 05/18/2011  . Anemia 05/19/2011   Past Surgical History:  Past Surgical History  Procedure Date  . Eye surgery   . Fracture surgery   . Hx fx lt wrist years ago    PT Assessment/Plan/Recommendation PT Assessment Clinical Impression Statement: Pt very weak and deconditioned, requires +2 total assistance and is only able to contribute ~ 30-40%. Pt struggles with PWB precautions and with remembering posterior hip precautions and should be reminded prior to any transfer and with bed mobility. Discussed D/C planning with pt and family, all are in agreement for SNF for rehab.  PT Recommendation/Assessment: Patient will need skilled PT in the acute care venue PT Problem List: Decreased strength;Decreased activity tolerance;Decreased mobility;Decreased cognition;Decreased knowledge of use of DME;Decreased knowledge of precautions;Decreased balance PT Therapy Diagnosis : Difficulty walking;Abnormality of gait;Generalized weakness;Acute pain PT Plan PT Frequency: Min 3X/week PT Treatment/Interventions: DME instruction;Gait training;Stair training;Functional mobility training;Therapeutic exercise PT Recommendation Recommendations for Other Services:  (none) Follow Up Recommendations: Skilled nursing facility Equipment Recommended: Defer to next venue PT Goals  Acute Rehab PT Goals PT Goal Formulation: With patient Time For Goal Achievement: 2 weeks Pt will go Supine/Side to Sit: with min  assist (While following hip precautions) PT Goal: Supine/Side to Sit - Progress: Progressing toward goal Pt will go Sit to Supine/Side: with min assist (following precautions) PT Goal: Sit to Supine/Side - Progress: Progressing toward goal Pt will Transfer Sit to Stand/Stand to Sit: with mod assist PT Transfer Goal: Sit to Stand/Stand to Sit - Progress: Progressing toward goal Pt will Transfer Bed to Chair/Chair to Bed: with mod assist PT Transfer Goal: Bed to Chair/Chair to Bed - Progress: Progressing toward goal  PT Evaluation Precautions/Restrictions  Restrictions Weight Bearing Restrictions: Yes RUE Weight Bearing: Partial weight bearing LLE Weight Bearing: Partial weight bearing Prior Functioning  Home Living Lives With: Other (Comment) (24/7 care) Receives Help From:  (2 nephews and nieces - help prn) Type of Home: Apartment Home Layout: One level Home Access: Stairs to enter Entrance Stairs-Rails: Right Entrance Stairs-Number of Steps: 1 Bathroom Shower/Tub: Walk-in shower (some days sponge bathes) Bathroom Toilet: Handicapped height (riser with handles) Home Adaptive Equipment: Bedside commode/3-in-1;Walker - rolling;Shower chair without back (lift chair) Prior Function Level of Independence: Needs assistance with ADLs Bath: Minimal Dressing: Minimal Driving: No Vocation: Retired Producer, television/film/video: Lethargic (minimally) Overall Cognitive Status: Impaired Memory: Decreased recall of precautions Memory Deficits: decreased processing Orientation Level: Oriented to person;Oriented to place Sensation/Coordination Sensation Light Touch: Appears Intact Extremity Assessment RLE Assessment RLE Assessment: Exceptions to [facility] RLE Strength RLE Overall Strength: Deficits RLE Overall Strength Comments: Generalized deconditioning, grossly >/= -3/5 LLE Assessment LLE Assessment: Exceptions to [facility] LLE Strength LLE Overall Strength Comments: Generalized  deconditioning, grossly >/= 2/5 Mobility (including Balance) Bed Mobility Bed Mobility: Yes Rolling Right: 4: Min assist Rolling Right Details (indicate cue type and reason): Tactile cues to reach for rail, unable to complete full roll.  Supine to Sit: 1: +2 Total assist Supine to Sit  Details (indicate cue type and reason): (Pt = 40%) Verbal and tactile cues for sequence and to maintain precautions.  Transfers Transfers: Yes Stand Pivot Transfers: 1: +2 Total assist Stand Pivot Transfer Details (indicate cue type and reason): (pt= 35%) Verbal cues for PWB precautions, facilitation of hip extension through hips, assistance with RW negotiation and to pivot to chair.  Ambulation/Gait Ambulation/Gait: No Stairs: No Wheelchair Mobility Wheelchair Mobility: No  Posture/Postural Control Posture/Postural Control: No significant limitations Balance Balance Assessed: Yes Static Sitting Balance Static Sitting - Level of Assistance: 5: Stand by assistance Exercise    End of Session PT - End of Session Equipment Utilized During Treatment: Gait belt Activity Tolerance: Patient limited by fatigue Patient left: in chair;with call bell in reach;with family/visitor present Nurse Communication: Mobility status for transfers;Mobility status for ambulation;Need for lift equipment;Weight bearing status (Posterior Hip Precautions) General Behavior During Session: [facility] for tasks performed Cognition: [facility] for tasks performed  Cheek, Dahlia Client Carleene Mains, Iaan Oregel) 05/20/2011, 10:36 AM

## 2011-05-20 NOTE — Progress Notes (Signed)
ANTICOAGULATION CONSULT NOTE   Pharmacy Consult for Coumadin Indication: atrial fibrillation, DVT prophylaxis s/p hip ORIF  Allergies  Allergen Reactions  . Erythromycin     Reaction and severity  unknown    Patient Measurements: Height: 5\' 3"  (160 cm) Weight: 117 lb 4.6 oz (53.2 kg) IBW/kg (Calculated) : 52.4   Vital Signs: Temp: 98.5 F (36.9 C) (11/12 0548) BP: 97/60 mmHg (11/12 0548) Pulse Rate: 100  (11/12 1000)  Labs:  Basename 05/20/11 1153 05/19/11 0540 05/18/11 2100  HGB -- 10.5* 10.7*  HCT -- 31.3* 31.7*  PLT -- 116* 119*  APTT -- -- 30  LABPROT 21.9* 19.6* 19.2*  INR 1.88* 1.63* 1.58*  HEPARINUNFRC -- -- --  CREATININE -- 1.20* 1.13*  CKTOTAL -- -- --  CKMB -- -- --  TROPONINI -- -- --   Estimated Creatinine Clearance: 21.7 ml/min (by C-G formula based on Cr of 1.2).  Medical History: Past Medical History  Diagnosis Date  . HTN (hypertension) 05/18/2011  . CHF (congestive heart failure) 05/18/2011  . A-fib 05/18/2011  . Anemia 05/19/2011    Medications:  Prescriptions prior to admission  Medication Sig Dispense Refill  . CALCIUM PO Take 1 tablet by mouth daily.        . furosemide (LASIX) 80 MG tablet Take 80 mg by mouth 2 (two) times daily.        . hydroxypropyl methylcellulose (ISOPTO TEARS) 2.5 % ophthalmic solution Place 1 drop into both eyes 4 (four) times daily as needed. Dry eyes       . potassium chloride (KLOR-CON) 10 MEQ CR tablet Take 20 mEq by mouth daily.        . simvastatin (ZOCOR) 20 MG tablet Take 20 mg by mouth at bedtime.        Marland Kitchen warfarin (COUMADIN) 2.5 MG tablet Take 2.5 mg by mouth at bedtime.        Marland Kitchen oxybutynin (DITROPAN) 5 MG tablet Take 5 mg by mouth 2 (two) times daily.          Assessment: Afib, s/p hip IM Nailing:  Restarting anticoagulation with Coumadin, this is 2nd day of restart.  INR=1.88.  Goal of Therapy:  INR 2-3   Plan:  Coumadin 2.5mg  tonight. Daily PT/INR.  Marylouise Stacks 05/20/2011,2:23  PM

## 2011-05-20 NOTE — Progress Notes (Signed)
Utilization Review Completed.Izac Faulkenberry T11/06/2011   

## 2011-05-21 LAB — CBC
MCH: 33.5 pg (ref 26.0–34.0)
MCHC: 33 g/dL (ref 30.0–36.0)
Platelets: 85 10*3/uL — ABNORMAL LOW (ref 150–400)
RBC: 2.57 MIL/uL — ABNORMAL LOW (ref 3.87–5.11)

## 2011-05-21 LAB — BASIC METABOLIC PANEL
CO2: 25 mEq/L (ref 19–32)
Calcium: 8.6 mg/dL (ref 8.4–10.5)
GFR calc non Af Amer: 44 mL/min — ABNORMAL LOW (ref >90)
Potassium: 4.3 mEq/L (ref 3.5–5.1)
Sodium: 142 mEq/L (ref 135–145)

## 2011-05-21 LAB — PROTIME-INR
INR: 2.35 — ABNORMAL HIGH (ref 0.00–1.49)
Prothrombin Time: 26.1 seconds — ABNORMAL HIGH (ref 11.6–15.2)

## 2011-05-21 MED ORDER — FENTANYL CITRATE 0.05 MG/ML IJ SOLN: 50.0000 ug | INTRAMUSCULAR | Status: DC | PRN

## 2011-05-21 MED ORDER — WARFARIN 1.25 MG HALF TABLET
1.2500 mg | ORAL_TABLET | Freq: Once | ORAL | Status: AC
  Administered 2011-05-21: 1.25 mg via ORAL
  Filled 2011-05-21: qty 1

## 2011-05-21 NOTE — Progress Notes (Signed)
Clinical Social Worker spoke with patient HCPOA regarding patient bed offers. Patient HCPOA confirmed skilled nursing choice at [facility]. Patient HCPOA will complete admission paper work at [facility] on 05/22/2011 at 9am for patient anticipated discharge to skilled nursing 05/22/2011. Clinical Social Worker continuing to follow to assist with patient discharge plans.    Doree Albee  [phone]  05/21/2011 16:47

## 2011-05-21 NOTE — Progress Notes (Signed)
Physical Therapy Treatment Patient Details Name: Donna Jones MRN: [id number] DOB: 06-Oct-1910 Today's Date: 05/21/2011  PT Assessment/Plan  PT - Assessment/Plan Comments on Treatment Session: Pt with decreased ability to place UEs on RW in standing. Difficulty with transfers however is progressing with ability to contribute.  PT Plan: Discharge plan remains appropriate PT Frequency: Min 3X/week Follow Up Recommendations: Skilled nursing facility Equipment Recommended: Defer to next venue PT Goals  Acute Rehab PT Goals PT Goal Formulation: With patient Time For Goal Achievement: 2 weeks Pt will go Supine/Side to Sit: with min assist (While following hip precautions) PT Goal: Supine/Side to Sit - Progress: Progressing toward goal Pt will go Sit to Supine/Side: with min assist (following precautions) PT Goal: Sit to Supine/Side - Progress: Progressing toward goal Pt will Transfer Sit to Stand/Stand to Sit: with mod assist PT Transfer Goal: Sit to Stand/Stand to Sit - Progress: Progressing toward goal Pt will Transfer Bed to Chair/Chair to Bed: with mod assist PT Transfer Goal: Bed to Chair/Chair to Bed - Progress: Progressing toward goal  PT Treatment Precautions/Restrictions  Restrictions Weight Bearing Restrictions: Yes RUE Weight Bearing: Partial weight bearing RLE Weight Bearing: Non weight bearing LLE Weight Bearing: Partial weight bearing Mobility (including Balance) Bed Mobility Bed Mobility: Yes Supine to Sit: 1: +2 Total assist Supine to Sit Details (indicate cue type and reason): (pt = 40%)  Verbal and tactile cues for sequence and to maintain precautions, encourage contribution through UEs Sitting - Scoot to Edge of Bed: 3: Mod assist Sitting - Scoot to Edge of Bed Details (indicate cue type and reason): PT use of pad to advance and position pelvis, pt with decr. ability to perform Transfers Transfers: Yes Sit to Stand: 1: +2 Total assist;From bed;From  chair/3-in-1 Sit to Stand Details (indicate cue type and reason): (Pt = 35%) Verbal and tactile cues to maintain PWB precautions for LLE, use of UEs Stand to Sit: 1: +2 Total assist;To chair/3-in-1 Stand to Sit Details: (Pt = 35%) Verbal and tactile cues to maintain PWB precautions for LLE, use of UEs Stand Pivot Transfers: 1: +2 Total assist Stand Pivot Transfer Details (indicate cue type and reason): (pt= 40%) Verbal cues for PWB precautions, facilitation of hip extension through pelvis assistance with RW negotiation  Ambulation/Gait Ambulation/Gait: No Stairs: No Wheelchair Mobility Wheelchair Mobility: No  Posture/Postural Control Posture/Postural Control: No significant limitations Balance Balance Assessed: Yes Static Sitting Balance Static Sitting - Balance Support: Bilateral upper extremity supported;Feet supported Static Sitting - Level of Assistance: 5: Stand by assistance Exercise    End of Session PT - End of Session Equipment Utilized During Treatment: Gait belt Activity Tolerance: Patient limited by fatigue Patient left: in chair;with call bell in reach Nurse Communication: Mobility status for transfers;Mobility status for ambulation;Need for lift equipment;Weight bearing status (Posterior Hip Precautions) General Behavior During Session: Lethargic Cognition: WFL for tasks performed (however pt with some confusion)  Ellin Saba) [phone] 05/21/2011, 11:58 AM

## 2011-05-21 NOTE — Progress Notes (Signed)
Occupational Therapy Evaluation Patient Details Name: Donna Jones MRN: [id number] DOB: 05-23-1911 Today's Date: 05/21/2011  Noted pt needing total A for mobilty and is planning on SNF.  Will defer OT eval to SNF.  [place], [place] [phone]

## 2011-05-21 NOTE — Progress Notes (Signed)
Subjective:  Objective: Filed Vitals:   05/21/11 0845 05/21/11 0920 05/21/11 0930 05/21/11 1436  BP: 110/67   90/55  Pulse:    99  Temp:    98.6 F (37 C)  TempSrc:    Oral  Resp:    20  Height:      Weight:      SpO2: 99% 92% 97% 100%   Weight change:   Intake/Output Summary (Last 24 hours) at 05/21/11 1932 Last data filed at 05/21/11 1437  Gross per 24 hour  Intake    702 ml  Output    700 ml  Net      2 ml    General: Alert, awake, oriented x3, in no acute distress.  HEENT: [place]/AT PEERL, EOMI Neck: Trachea midline,  no masses, no thyromegal,y no JVD, no carotid bruit OROPHARYNX:  Moist, No exudate/ erythema/lesions.  Heart: Regular rate and rhythm, without murmurs, rubs, gallops, PMI non-displaced, no heaves or thrills on palpation.  Lungs: Clear to auscultation, no wheezing or rhonchi noted. No increased vocal fremitus resonant to percussion  Abdomen: Soft, nontender, nondistended, positive bowel sounds, no masses no hepatosplenomegaly noted..  Neuro: No focal neurological deficits noted cranial nerves II through XII grossly intact. DTRs 2+ bilaterally upper and lower extremities. Strength 5 out of 5 in bilateral upper and lower extremities. Musculoskeletal: No warm swelling or erythema around joints, no spinal tenderness noted. Psychiatric: Patient alert and oriented x3, good insight and cognition, good recent to remote recall. Lymph node survey: No cervical axillary or inguinal lymphadenopathy noted.     Lab Results:  Basename 05/21/11 0630 05/19/11 0540 05/18/11 2100  NA 142 144 --  K 4.3 3.7 --  CL 110 106 --  CO2 25 29 --  GLUCOSE 112* 76 --  BUN 21 24* --  CREATININE 1.03 1.20* --  CALCIUM 8.6 8.9 --  MG -- -- 2.1  PHOS -- -- 3.5    Basename 05/18/11 2100  AST 16  ALT 9  ALKPHOS 55  BILITOT 1.2  PROT 5.9*  ALBUMIN 3.3*   No results found for this basename: LIPASE:2,AMYLASE:2 in the last 72 hours  Basename 05/21/11 0630 05/19/11 0540  WBC 8.4  6.5  NEUTROABS -- --  HGB 8.6* 10.5*  HCT 26.1* 31.3*  MCV 101.6* 98.7  PLT 85* 116*   No results found for this basename: CKTOTAL:3,CKMB:3,CKMBINDEX:3,TROPONINI:3 in the last 72 hours No results found for this basename: POCBNP:3 in the last 72 hours No results found for this basename: DDIMER:2 in the last 72 hours No results found for this basename: HGBA1C:2 in the last 72 hours No results found for this basename: CHOL:2,HDL:2,LDLCALC:2,TRIG:2,CHOLHDL:2,LDLDIRECT:2 in the last 72 hours  Basename 05/18/11 2100  TSH 2.556  T4TOTAL --  T3FREE --  THYROIDAB --   No results found for this basename: VITAMINB12:2,FOLATE:2,FERRITIN:2,TIBC:2,IRON:2,RETICCTPCT:2 in the last 72 hours  Micro Results: No results found for this or any previous visit (from the past 240 hour(s)).  Studies/Results: Dg Chest 2 View  05/19/2011  *RADIOLOGY REPORT*  Clinical Data: Preoperative chest radiograph for left femoral intertrochanteric fracture.  CHEST - 2 VIEW  Comparison: Chest radiograph performed 05/18/2011  Findings: The lungs are well-aerated.  Minimal bilateral atelectasis or scarring is noted.  Mild peripheral nodularity appears stable from prior studies.  There is no evidence of pleural effusion or pneumothorax.  The heart is mildly enlarged; the patient is status post median sternotomy.  No acute osseous abnormalities are seen.  IMPRESSION:  1.  Minimal bilateral atelectasis or scarring noted; mild chronic lung changes seen. 2.  Mild cardiomegaly.  Original Report Authenticated By: Tonia Ghent, M.D.   Dg Femur Left  05/19/2011  *RADIOLOGY REPORT*  Clinical Data: Left hip fracture.  LEFT FEMUR - 2 VIEW  Comparison: Radiographs dated 05/18/2011  Findings: AP and lateral C-arm images demonstrate insertion of an intramedullary rod in the femoral shaft and a compression screw across the intertrochanteric fracture of the proximal left femur. Position of the fragments is anatomic.  IMPRESSION: Satisfactory  appearance of the left hip after open reduction and internal fixation of intertrochanteric fracture.  Original Report Authenticated By: Gwynn Burly, M.D.   Dg C-arm 61-120 Min  05/19/2011  CLINICAL DATA: fracture left femur   C-ARM 61-120 MINUTES  Fluoroscopy was utilized by the requesting physician.  No radiographic  interpretation.      Medications: I have reviewed the patient's current medications. Scheduled Meds:   . docusate sodium  100 mg Oral BID  . furosemide  40 mg Oral BID  . oxybutynin  5 mg Oral BID  . polyvinyl alcohol  1 drop Both Eyes TID  . potassium chloride  10 mEq Oral Daily  . simvastatin  20 mg Oral q1800  . warfarin  1.25 mg Oral ONCE-1800   Continuous Infusions:   . DISCONTD: sodium chloride 75 mL/hr at 05/20/11 1545   PRN Meds:.acetaminophen, acetaminophen, bisacodyl, bisacodyl, HYDROcodone-acetaminophen, lip balm, magnesium hydroxide, menthol-cetylpyridinium, metoCLOPramide (REGLAN) injection, metoCLOPramide, morphine injection, ondansetron (ZOFRAN) IV, ondansetron, phenol, polyethylene glycol, senna-docusate, sodium phosphate Assessment/Plan: Patient Active Hospital Problem List: Hip fracture, intertrochanteric (05/18/2011)   Assessment: Patient is therapeutic on Coumadin at this time. Stable for discharge from orthopedic services prospective. Awaiting staff disposition.  CHF (congestive heart failure) (05/18/2011)   Assessment: Type of CHF undocumented on this admission. However the patient is clinically compensated and stable.    Plan continue Lasix.  A-fib (05/18/2011)   Assessment: Heart rate controlled. Patient therapeutic on Coumadin.  Anemia (05/19/2011)   Assessment: Patient has had a drop in hemoglobin the from 10.5-8.6. There is no evidence of active bleeding we'll continue to monitor May need transfusion prior to discharge.   not yet medically stable for discharge   LOS: 3 days

## 2011-05-21 NOTE — Progress Notes (Signed)
Subjective: Pt feels better   Objective: Vital signs in last 24 hours: Temp:  [97.5 F (36.4 C)-98.6 F (37 C)] 98.6 F (37 C) (11/13 1436) Pulse Rate:  [78-99] 99  (11/13 1436) Resp:  [18-20] 20  (11/13 1436) BP: (86-110)/(55-67) 90/55 mmHg (11/13 1436) SpO2:  [92 %-100 %] 100 % (11/13 1436)  Intake/Output from previous day: 11/12 0701 - 11/13 0700 In: 1530 [P.O.:580; I.V.:900; IV Piggyback:50] Out: 1050 [Urine:1050] Intake/Output this shift: Total I/O In: 582 [P.O.:582] Out: -   Exam:  Neurovascular intact Sensation intact distally Intact pulses distally Dorsiflexion/Plantar flexion intact  Labs:  Basename 05/21/11 0630 05/19/11 0540 05/18/11 2100  HGB 8.6* 10.5* 10.7*    Basename 05/21/11 0630 05/19/11 0540  WBC 8.4 6.5  RBC 2.57* 3.17*  HCT 26.1* 31.3*  PLT 85* 116*    Basename 05/21/11 0630 05/19/11 0540  NA 142 144  K 4.3 3.7  CL 110 106  CO2 25 29  BUN 21 24*  CREATININE 1.03 1.20*  GLUCOSE 112* 76  CALCIUM 8.6 8.9    Basename 05/21/11 0630 05/20/11 1153  LABPT -- --  INR 2.35* 1.88*    Assessment/Plan: Pt stable - ready for transfer to snf - inr therapeutic - hl iv - dc foley - case worker to work on [employer] - fu with me 10 days   DEAN,GREGORY SCOTT 05/21/2011, 3:11 PM

## 2011-05-21 NOTE — Progress Notes (Signed)
ANTICOAGULATION CONSULT NOTE - Follow Up Consult  Pharmacy Consult for Warfarin Indication: Afib and VTE px s/p hip ORIF  Allergies  Allergen Reactions  . Erythromycin     Reaction and severity  unknown    Patient Measurements: Height: 5\' 3"  (160 cm) Weight: 117 lb 4.6 oz (53.2 kg) IBW/kg (Calculated) : 52.4    Vital Signs: Temp: 97.7 F (36.5 C) (11/13 0500) Temp src: Oral (11/13 0500) BP: 110/67 mmHg (11/13 0845) Pulse Rate: 99  (11/13 0500)  Labs:  Basename 05/21/11 0630 05/20/11 1153 05/19/11 0540 05/18/11 2100  HGB 8.6* -- 10.5* --  HCT 26.1* -- 31.3* 31.7*  PLT 85* -- 116* 119*  APTT -- -- -- 30  LABPROT 26.1* 21.9* 19.6* --  INR 2.35* 1.88* 1.63* --  HEPARINUNFRC -- -- -- --  CREATININE 1.03 -- 1.20* 1.13*  CKTOTAL -- -- -- --  CKMB -- -- -- --  TROPONINI -- -- -- --   Estimated Creatinine Clearance: 25.2 ml/min (by C-G formula based on Cr of 1.03).   Medications:  Prescriptions prior to admission  Medication Sig Dispense Refill  . CALCIUM PO Take 1 tablet by mouth daily.        . furosemide (LASIX) 80 MG tablet Take 80 mg by mouth 2 (two) times daily.        . hydroxypropyl methylcellulose (ISOPTO TEARS) 2.5 % ophthalmic solution Place 1 drop into both eyes 4 (four) times daily as needed. Dry eyes      . potassium chloride (KLOR-CON) 10 MEQ CR tablet Take 20 mEq by mouth daily.        . simvastatin (ZOCOR) 20 MG tablet Take 20 mg by mouth at bedtime.        Marland Kitchen warfarin (COUMADIN) 2.5 MG tablet Take 2.5 mg by mouth at bedtime.        Marland Kitchen oxybutynin (DITROPAN) 5 MG tablet Take 5 mg by mouth 2 (two) times daily.         Scheduled:    . docusate sodium  100 mg Oral BID  . furosemide  40 mg Oral BID  . oxybutynin  5 mg Oral BID  . polyvinyl alcohol  1 drop Both Eyes TID  . potassium chloride  10 mEq Oral Daily  . simvastatin  20 mg Oral q1800  . warfarin  2.5 mg Oral ONCE-1800  . DISCONTD: hydroxypropyl methylcellulose  1 drop Both Eyes TID     Assessment: [name] on warfarin for hx Afib and for VTE px s/p IM hip nailing with a therapeutic INR this morning. INR trending up quickly (INR 2.35 <--1.88 <--1.63). Given patient's age and recent surgery (could have altered eating habits, etc.), will decrease today's dose to keep within range. Patient has received her home dose of 2.5 mg for the past 2 days. Hgb/Hct dropped since surgery however no signs/symptoms of bleeding noted in chart.  Goal of Therapy:  INR 2-3   Plan:  1. Warfarin 1.25 mg x 1 dose at 1800 today 2. Will f/u PT/INR in the a.m.  Rolley Sims 05/21/2011,2:25 PM

## 2011-05-21 NOTE — Clinical Documentation Improvement (Signed)
CHF DOCUMENTATION CLARIFICATION QUERY  Please update your documentation within the medical record to reflect your response to this query.                                                                                     05/21/11  Dear Dr. Judie Petit. [facility],  In a better effort to capture your patient's severity of illness, reflect appropriate length of stay and utilization of resources, a review of the patient medical record has revealed the following indicators the diagnosis of Heart Failure.    Based on your clinical judgment, please clarify and document in a progress note and/or discharge summary the clinical condition associated with the following supporting information:  In responding to this query please exercise your independent judgment.  The fact that a query is asked, does not imply that any particular answer is desired or expected.  Per MD note on 11/11 " compensated chf" documented please clarify type of CHF if known   (required in order to capture data) .  Thank you   Possible Clinical Conditions?  Chronic Systolic Congestive Heart Failure  Chronic Diastolic Congestive Heart Failure  Chronic Systolic & Diastolic Congestive Heart Failure  Other Condition________________________________________  Cannot Clinically Determine  Supporting Information:  Risk Factors: s/p ORIF lt hip, h/o chf, acute on chronic renal failure   Diagnostics: chest xray 11/10 : Minimal bilateral atelectasis or scarring noted; mild chronic  lung changes seen. 2. Mild cardiomegaly.  Treatment: Lasix 80mg  po bid on hold at this time  Reviewed:per d/c summary unable to clarify systolic or diastolic chf/ only acuity "compensated"  Thank You,  Sincerely, Leonette Most Marjory Meints  Clinical Documentation Specialist RN BSN:  Pager  [phone]  Health Information Management [place]

## 2011-05-22 ENCOUNTER — Encounter (HOSPITAL_COMMUNITY): Payer: Self-pay | Admitting: Orthopedic Surgery

## 2011-05-22 LAB — DIFFERENTIAL
Basophils Absolute: 0 10*3/uL (ref 0.0–0.1)
Basophils Relative: 0 % (ref 0–1)
Eosinophils Absolute: 0.3 10*3/uL (ref 0.0–0.7)
Eosinophils Relative: 4 % (ref 0–5)
Neutrophils Relative %: 73 % (ref 43–77)

## 2011-05-22 LAB — PREPARE RBC (CROSSMATCH)

## 2011-05-22 LAB — PROTIME-INR
INR: 2.31 — ABNORMAL HIGH (ref 0.00–1.49)
Prothrombin Time: 25.8 seconds — ABNORMAL HIGH (ref 11.6–15.2)

## 2011-05-22 LAB — CBC
MCHC: 33.1 g/dL (ref 30.0–36.0)
RDW: 13.7 % (ref 11.5–15.5)
WBC: 7.1 10*3/uL (ref 4.0–10.5)

## 2011-05-22 MED ORDER — WARFARIN SODIUM 2 MG PO TABS
2.0000 mg | ORAL_TABLET | Freq: Once | ORAL | Status: AC
  Administered 2011-05-22: 2 mg via ORAL
  Filled 2011-05-22: qty 1

## 2011-05-22 MED ORDER — SODIUM CHLORIDE 0.9 % IV SOLN
INTRAVENOUS | Status: DC
  Administered 2011-05-22: 01:00:00 via INTRAVENOUS

## 2011-05-22 NOTE — Progress Notes (Signed)
Pt voided an amount of 100 ml at 0615. Bladder scan was not needed. Delrae Sawyers

## 2011-05-22 NOTE — Progress Notes (Signed)
Subjective: Patient's at the bedside. Informed and the patient was getting 1 unit of packed red blood cells in transfusion today. The patient herself had no complaints. Although she says that she's eating well the family reports that her eating has been poor for some time. Objective: Filed Vitals:   05/22/11 0000 05/22/11 0359 05/22/11 0457 05/22/11 0930  BP:   104/69 102/66  Pulse:   90 103  Temp:   98.1 F (36.7 C) 99.2 F (37.3 C)  TempSrc:   Oral Oral  Resp: 18 18 17 18   Height:      Weight:      SpO2: 98% 98% 94% 96%   Weight change:   Intake/Output Summary (Last 24 hours) at 05/22/11 1506 Last data filed at 05/22/11 1220  Gross per 24 hour  Intake   1075 ml  Output    790 ml  Net    285 ml    General: Alert, awake, oriented x3, in no acute distress.  HEENT: [place]/AT [place]ER[place], EOMI Neck: Trachea midline,  no masses, no thyromegal,y no JVD, no carotid bruit OROPHARYNX:  Moist, No exudate/ erythema/lesions.  Heart: Regular rate and rhythm, without murmurs, rubs, gallops, PMI non-displaced, no heaves or thrills on palpation.  Lungs: Clear to auscultation, no wheezing or rhonchi noted. No increased vocal fremitus resonant to percussion  Abdomen: Soft, nontender, nondistended, positive bowel sounds, no masses no hepatosplenomegaly noted..  Neuro: No focal neurological deficits noted cranial nerves II through XII grossly intact. DTRs 2+ bilaterally upper and lower extremities. Strength 3/5 in bilateral upper and lower extremities. Musculoskeletal: No warm swelling or erythema around joints, no spinal tenderness noted. Psychiatric: Patient alert and oriented x3, good insight and cognition, good recent to remote recall. Lymph node survey: No cervical axillary or inguinal lymphadenopathy noted.     Lab Results:  Basename 05/21/11 0630  NA 142  K 4.3  CL 110  CO2 25  GLUCOSE 112*  BUN 21  CREATININE 1.03  CALCIUM 8.6  MG --  PHOS --   No results found for this basename:  AST:2,ALT:2,ALKPHOS:2,BILITOT:2,PROT:2,ALBUMIN:2 in the last 72 hours No results found for this basename: LIPASE:2,AMYLASE:2 in the last 72 hours  Basename 05/22/11 0558 05/21/11 0630  WBC 7.1 8.4  NEUTROABS 5.1 --  HGB 8.1* 8.6*  HCT 24.5* 26.1*  MCV 100.4* 101.6*  PLT 98* 85*   No results found for this basename: CKTOTAL:3,CKMB:3,CKMBINDEX:3,TROPONINI:3 in the last 72 hours No results found for this basename: POCBNP:3 in the last 72 hours No results found for this basename: DDIMER:2 in the last 72 hours No results found for this basename: HGBA1C:2 in the last 72 hours No results found for this basename: CHOL:2,HDL:2,LDLCALC:2,TRIG:2,CHOLHDL:2,LDLDIRECT:2 in the last 72 hours No results found for this basename: TSH,T4TOTAL,FREET3,T3FREE,THYROIDAB in the last 72 hours No results found for this basename: VITAMINB12:2,FOLATE:2,FERRITIN:2,TIBC:2,IRON:2,RETICCTPCT:2 in the last 72 hours  Micro Results: No results found for this or any previous visit (from the past 240 hour(s)).  Studies/Results: Dg Chest 2 View  05/19/2011  *RADIOLOGY REPORT*  Clinical Data: Preoperative chest radiograph for left femoral intertrochanteric fracture.  CHEST - 2 VIEW  Comparison: Chest radiograph performed 05/18/2011  Findings: The lungs are well-aerated.  Minimal bilateral atelectasis or scarring is noted.  Mild peripheral nodularity appears stable from prior studies.  There is no evidence of pleural effusion or pneumothorax.  The heart is mildly enlarged; the patient is status post median sternotomy.  No acute osseous abnormalities are seen.  IMPRESSION:  1.  Minimal  bilateral atelectasis or scarring noted; mild chronic lung changes seen. 2.  Mild cardiomegaly.  Original Report Authenticated By: Tonia Ghent, M.D.   Dg Femur Left  05/19/2011  *RADIOLOGY REPORT*  Clinical Data: Left hip fracture.  LEFT FEMUR - 2 VIEW  Comparison: Radiographs dated 05/18/2011  Findings: AP and lateral C-arm images demonstrate  insertion of an intramedullary rod in the femoral shaft and a compression screw across the intertrochanteric fracture of the proximal left femur. Position of the fragments is anatomic.  IMPRESSION: Satisfactory appearance of the left hip after open reduction and internal fixation of intertrochanteric fracture.  Original Report Authenticated By: Gwynn Burly, M.D.   Dg C-arm 61-120 Min  05/19/2011  CLINICAL DATA: fracture left femur   C-ARM 61-120 MINUTES  Fluoroscopy was utilized by the requesting physician.  No radiographic  interpretation.      Medications: I have reviewed the patient's current medications. Scheduled Meds:   . docusate sodium  100 mg Oral BID  . furosemide  40 mg Oral BID  . oxybutynin  5 mg Oral BID  . polyvinyl alcohol  1 drop Both Eyes TID  . potassium chloride  10 mEq Oral Daily  . simvastatin  20 mg Oral q1800  . warfarin  1.25 mg Oral ONCE-1800  . warfarin  2 mg Oral ONCE-1800   Continuous Infusions:   . sodium chloride 50 mL/hr at 05/22/11 1100  . DISCONTD: sodium chloride 75 mL/hr at 05/20/11 1545   PRN Meds:.acetaminophen, acetaminophen, bisacodyl, bisacodyl, fentaNYL, HYDROcodone-acetaminophen, lip balm, magnesium hydroxide, menthol-cetylpyridinium, metoCLOPramide (REGLAN) injection, metoCLOPramide, morphine injection, ondansetron (ZOFRAN) IV, ondansetron, phenol, polyethylene glycol, senna-docusate, sodium phosphate Assessment/Plan: Patient Active Hospital Problem List: Hip fracture, intertrochanteric (05/18/2011)   Assessment: Stable to begin skilled therapy for an orthopedic standpoint. The patient a postoperative Coumadin therapeutic INR 0.31.  Hypotension, (05/18/2011)   Assessment: Patient and family report the patient does have chronic hypotension for several years with blood pressures systolic running between the high 80s to 90s. The patient has been asymptomatic with systolic blood pressures in this range. Presently the patient has no complaints  of orthostasis with movement.    CHF (congestive heart failure) (05/18/2011)   Assessment: Not assessed on this admission. Unclear as to whether this is systolic or diastolic. Patient clinically compensated.   A-fib (05/18/2011)   Assessment: Patient continues in a rhythm of atrial fibrillation heart rate is controlled.  Anemia (05/19/2011)   Assessment: On admission to the hospital the patient had a hemoglobin of 10.7. The patient has trended down to hemoglobin of 8.1 postsurgically. The patient shows no active signs of bleeding however given the patient's overall medical condition I think it is valuable to the patient for transfusion of one unit of packed red blood cells. I discussed this with her nephews to have a power of attorney and we'll transfuse 1 unit of PRBCs today.    Anticipated patient we were ready to discharge to the skilled nursing facility on tomorrow barring any complications.   LOS: 4 days

## 2011-05-22 NOTE — Progress Notes (Signed)
Clinical social worker continuing to follow to assist with patient discharge plans to skilled nursing for short term rehab when patient medically stable. Patient anticipated discharge date was delayed due to hemoglobin levels. Patient, patient  family, and HCPOA, and nursing facility informed. Patient plans to dc to [facility] nursing center when medically stable.   Catha Gosselin, LCSWA   .05/22/2011 10:57am

## 2011-05-22 NOTE — Progress Notes (Signed)
ANTICOAGULATION CONSULT NOTE - Follow Up Consult  Pharmacy Consult for Warfarin Indication: Afib and VTE px s/p hip ORIF  Allergies  Allergen Reactions  . Erythromycin     Reaction and severity  unknown    Patient Measurements: Height: 5\' 3"  (160 cm) Weight: 117 lb 4.6 oz (53.2 kg) IBW/kg (Calculated) : 52.4    Vital Signs: Temp: 98.1 F (36.7 C) (11/14 0457) Temp src: Oral (11/14 0457) BP: 104/69 mmHg (11/14 0457) Pulse Rate: 90  (11/14 0457)  Labs:  Basename 05/22/11 0558 05/21/11 0630 05/20/11 1153  HGB 8.1* 8.6* --  HCT 24.5* 26.1* --  PLT 98* 85* --  APTT -- -- --  LABPROT 25.8* 26.1* 21.9*  INR 2.31* 2.35* 1.88*  HEPARINUNFRC -- -- --  CREATININE -- 1.03 --  CKTOTAL -- -- --  CKMB -- -- --  TROPONINI -- -- --   Estimated Creatinine Clearance: 25.2 ml/min (by C-G formula based on Cr of 1.03).   Medications:  Prescriptions prior to admission  Medication Sig Dispense Refill  . CALCIUM PO Take 1 tablet by mouth daily.        . furosemide (LASIX) 80 MG tablet Take 80 mg by mouth 2 (two) times daily.        . hydroxypropyl methylcellulose (ISOPTO TEARS) 2.5 % ophthalmic solution Place 1 drop into both eyes 4 (four) times daily as needed. Dry eyes      . potassium chloride (KLOR-CON) 10 MEQ CR tablet Take 20 mEq by mouth daily.        . simvastatin (ZOCOR) 20 MG tablet Take 20 mg by mouth at bedtime.        Marland Kitchen warfarin (COUMADIN) 2.5 MG tablet Take 2.5 mg by mouth at bedtime.        Marland Kitchen oxybutynin (DITROPAN) 5 MG tablet Take 5 mg by mouth 2 (two) times daily.         Scheduled:     . docusate sodium  100 mg Oral BID  . furosemide  40 mg Oral BID  . oxybutynin  5 mg Oral BID  . polyvinyl alcohol  1 drop Both Eyes TID  . potassium chloride  10 mEq Oral Daily  . simvastatin  20 mg Oral q1800  . warfarin  1.25 mg Oral ONCE-1800    Assessment: [name] on warfarin for hx Afib and for VTE px s/p IM hip nailing with a therapeutic INR this morning. INR = 2.31.    Hgb/Hct dropped since surgery indicating some post-op anemia, thrombocytopenia improving and no noted signs/symptoms of bleeding.  Home dose is 2.5mg  daily, however she received 1.25mg  yesterday and was still within goal this am.  Will give only 2 mg today and f/u INR tomorrow.  Hopefully, can get her back on her home regimen soon.  Goal of Therapy:  INR 2-3   Plan:  1. Warfarin 2 mg x 1 dose at 1800 today 2. Will f/u PT/INR in the a.m.  Letitia Libra, Christos Mixson Faye 05/22/2011,9:18 AM

## 2011-05-22 NOTE — Progress Notes (Signed)
Patient's foley per report from morning nurse was removed at 1830. At 0030 11/14 patient has still not voided. Bladder scanned showed that patient only had 153 cc of urine in bladder. Patient has had only about 30 cc of water intake when taking PM medicines. Provider on call notified. Order to place patient on fluids was given. RN to recheck bladder scan at 0600 if patient has not voided and to reinsert foley if it is evident by bladder scan that patient is retaining urine. Will continue to monitor. Donna Jones

## 2011-05-23 ENCOUNTER — Inpatient Hospital Stay (HOSPITAL_COMMUNITY): Payer: Medicare Other

## 2011-05-23 LAB — TYPE AND SCREEN
ABO/RH(D): A POS
Antibody Screen: NEGATIVE
Unit division: 0

## 2011-05-23 LAB — DIFFERENTIAL
Basophils Relative: 0 % (ref 0–1)
Eosinophils Absolute: 0.2 10*3/uL (ref 0.0–0.7)
Eosinophils Relative: 3 % (ref 0–5)
Lymphs Abs: 0.9 10*3/uL (ref 0.7–4.0)

## 2011-05-23 LAB — CBC
MCH: 32.7 pg (ref 26.0–34.0)
MCHC: 33.7 g/dL (ref 30.0–36.0)
MCV: 96.9 fL (ref 78.0–100.0)
Platelets: 118 10*3/uL — ABNORMAL LOW (ref 150–400)
RBC: 2.94 MIL/uL — ABNORMAL LOW (ref 3.87–5.11)
RDW: 15.3 % (ref 11.5–15.5)

## 2011-05-23 MED ORDER — WARFARIN SODIUM 2.5 MG PO TABS
2.5000 mg | ORAL_TABLET | Freq: Once | ORAL | Status: DC
  Filled 2011-05-23: qty 1

## 2011-05-23 MED ORDER — HYDROCODONE-ACETAMINOPHEN 5-325 MG PO TABS: 1.0000 | ORAL_TABLET | Freq: Three times a day (TID) | ORAL | Status: AC | PRN

## 2011-05-23 MED ORDER — FUROSEMIDE 40 MG PO TABS: 40.0000 mg | ORAL_TABLET | Freq: Two times a day (BID) | ORAL | Status: AC

## 2011-05-23 MED ORDER — POLYETHYLENE GLYCOL 3350 17 G PO PACK: 17.0000 g | PACK | Freq: Every day | ORAL | Status: AC | PRN

## 2011-05-23 MED ORDER — BISACODYL 5 MG PO TBEC: 10.0000 mg | DELAYED_RELEASE_TABLET | Freq: Every day | ORAL | Status: AC | PRN

## 2011-05-23 MED ORDER — ACETAMINOPHEN 325 MG PO TABS: 650.0000 mg | ORAL_TABLET | Freq: Four times a day (QID) | ORAL | Status: AC | PRN

## 2011-05-23 NOTE — Progress Notes (Signed)
ANTICOAGULATION CONSULT NOTE - Follow Up Consult  Pharmacy Consult for Warfarin Indication: Afib and VTE px s/p hip ORIF  Allergies  Allergen Reactions  . Erythromycin     Reaction and severity  unknown    Patient Measurements: Height: 5\' 3"  (160 cm) Weight: 117 lb 4.6 oz (53.2 kg) IBW/kg (Calculated) : 52.4    Vital Signs: Temp: 97.4 F (36.3 C) (11/15 0300) Temp src: Oral (11/15 0300) BP: 123/88 mmHg (11/15 0300) Pulse Rate: 93  (11/14 2220)  Labs:  Basename 05/23/11 0700 05/22/11 0558 05/21/11 0630  HGB 9.6* 8.1* --  HCT 28.5* 24.5* 26.1*  PLT 118* 98* 85*  APTT -- -- --  LABPROT 26.2* 25.8* 26.1*  INR 2.36* 2.31* 2.35*  HEPARINUNFRC -- -- --  CREATININE -- -- 1.03  CKTOTAL -- -- --  CKMB -- -- --  TROPONINI -- -- --   Estimated Creatinine Clearance: 25.2 ml/min (by C-G formula based on Cr of 1.03).   Medications:  Prescriptions prior to admission  Medication Sig Dispense Refill  . CALCIUM PO Take 1 tablet by mouth daily.        . furosemide (LASIX) 80 MG tablet Take 80 mg by mouth 2 (two) times daily.        . hydroxypropyl methylcellulose (ISOPTO TEARS) 2.5 % ophthalmic solution Place 1 drop into both eyes 4 (four) times daily as needed. Dry eyes      . potassium chloride (KLOR-CON) 10 MEQ CR tablet Take 20 mEq by mouth daily.        . simvastatin (ZOCOR) 20 MG tablet Take 20 mg by mouth at bedtime.        Marland Kitchen warfarin (COUMADIN) 2.5 MG tablet Take 2.5 mg by mouth at bedtime.        Marland Kitchen oxybutynin (DITROPAN) 5 MG tablet Take 5 mg by mouth 2 (two) times daily.         Scheduled:     . docusate sodium  100 mg Oral BID  . furosemide  40 mg Oral BID  . oxybutynin  5 mg Oral BID  . polyvinyl alcohol  1 drop Both Eyes TID  . potassium chloride  10 mEq Oral Daily  . simvastatin  20 mg Oral q1800  . warfarin  2 mg Oral ONCE-1800    Assessment: [name] on warfarin for hx Afib and for VTE px s/p IM hip nailing with a therapeutic INR this morning. INR = 2.36.   Hbg and Hct increased after patient transfused last night.  No bleeding noted.   Home dose is 2.5mg  daily, however she received 2 mg yesterday and was still within goal this am.  Goal of Therapy:  INR 2-3   Plan:  1. Warfarin 2.5 mg x 1 dose at 1800 today 2. Will f/u PT/INR in the a.m.  Donna Jones Pager [phone] 05/23/2011,10:15 AM

## 2011-05-23 NOTE — Progress Notes (Signed)
.  Clinical social worker assisted with patient discharge to skilled nursing facility. .Patient transportation provided by [employer] [facility] with patient chart copy. .No further Clinical Social Work needs, signing off.   Catha Gosselin, LCSWA  .05/23/2011 15:20

## 2011-05-23 NOTE — Discharge Summary (Signed)
Patient ID: Donna Jones MRN: [id number] DOB/AGE: [birth year] 77 y.o.  Admit date: 05/18/2011 Discharge date: 05/23/2011  Primary Care Physician:  No primary provider on file. Orthopedic Surgeon:  Dr. Dorene Grebe  Discharge Diagnoses:   Present on Admission:  .Hip fracture .HTN (hypotension) .CHF (congestive heart failure - compensated) .Renal failure (ARF), acute on chronic  A-fib  Anemia   Current Discharge Medication List    START taking these medications   Details  acetaminophen (TYLENOL) 325 MG tablet Take 2 tablets (650 mg total) by mouth every 6 (six) hours as needed (or Fever >/= 101). Qty: 30 tablet    bisacodyl (DULCOLAX) 5 MG EC tablet Take 2 tablets (10 mg total) by mouth daily as needed for constipation. Qty: 30 tablet    HYDROcodone-acetaminophen (NORCO) 5-325 MG per tablet Take 1 tablet by mouth every 8 (eight) hours as needed. Qty: 5 tablet, Refills: 0    polyethylene glycol (MIRALAX / GLYCOLAX) packet Take 17 g by mouth daily as needed. Qty: 14 each      CONTINUE these medications which have CHANGED   Details  furosemide (LASIX) 40 MG tablet Take 1 tablet (40 mg total) by mouth 2 (two) times daily. Qty: 60 tablet, Refills: 0      CONTINUE these medications which have NOT CHANGED   Details  CALCIUM PO Take 1 tablet by mouth daily.      hydroxypropyl methylcellulose (ISOPTO TEARS) 2.5 % ophthalmic solution Place 1 drop into both eyes 4 (four) times daily as needed. Dry eyes    potassium chloride (KLOR-CON) 10 MEQ CR tablet Take 20 mEq by mouth daily.      simvastatin (ZOCOR) 20 MG tablet Take 20 mg by mouth at bedtime.      warfarin (COUMADIN) 2.5 MG tablet Take 2.5 mg by mouth at bedtime.      oxybutynin (DITROPAN) 5 MG tablet Take 5 mg by mouth 2 (two) times daily.          Disposition and Follow-up: The patient is being discharged to a skilled nursing facility.   An appointment for 10 days from today needs to be scheduled with Dr. Dorene Grebe (November 25).  The patient needs to be seen by a primary care physician for followup of her CHF, atrial fibrillation, and anemia. She is on chronic Coumadin and needs an INR checked on November 16.   Consultation:  Orthopedic Surgery  Significant Diagnostic Studies:  Dg Chest 2 View  05/19/2011  *RADIOLOGY REPORT*  Clinical Data: Preoperative chest radiograph for left femoral intertrochanteric fracture.  CHEST - 2 VIEW  Comparison: Chest radiograph performed 05/18/2011  Findings: The lungs are well-25th 2000rated.  Minimal bilateral atelectasis or scarring is noted.  Mild peripheral nodularity appears stable from prior studies.  There is no evidence of pleural effusion or pneumothorax.  The heart is mildly enlarged; the patient is status post median sternotomy.  No acute osseous abnormalities are seen.  IMPRESSION:  1.  Minimal bilateral atelectasis or scarring noted; mild chronic lung changes seen. 2.  Mild cardiomegaly.  Original Report Authenticated By: Tonia Ghent, M.D.   Dg Femur Left  05/19/2011  *RADIOLOGY REPORT*  Clinical Data: Left hip fracture.  LEFT FEMUR - 2 VIEW  Comparison: Radiographs dated 05/18/2011  Findings: AP and lateral C-arm images demonstrate insertion of an intramedullary rod in the femoral shaft and a compression screw across the intertrochanteric fracture of the proximal left femur. Position of the fragments is anatomic.  IMPRESSION: Satisfactory appearance  of the left hip after open reduction and internal fixation of intertrochanteric fracture.  Original Report Authenticated By: Gwynn Burly, M.D.    Physical Exam on Discharge: This is a thin elderly Caucasian female. Who is in no apparent distress  head is atraumatic normocephalic eyes are anicteric the pupils are equal and round mouth has moist mucous membranes Chest demonstrates no accessory muscle use no wheezes or crackles to my exam Heart has an irregular rate and irregular irregular rhythm without  murmurs rubs or gallops Abdomen soft nontender nondistended with good bowel sounds Extremities her left thigh has multiple bandages over her surgical site she has had some serous drainage. Her lower extremities have multiple areas of bruising. She currently has minimal edema in her distal right lower extremity no obvious edema in her left lower extremity. Psychiatric patient is awake alert oriented and cooperative.  Filed Vitals:   05/23/11 0300 05/23/11 0800 05/23/11 1200 05/23/11 1230  BP: 123/88     Pulse:      Temp: 97.4 F (36.3 C)     TempSrc: Oral     Resp: 19 18 18 18   Height:      Weight:      SpO2: 95% 99% 97% 97%     Intake/Output Summary (Last 24 hours) at 05/23/11 1323 Last data filed at 05/23/11 0700  Gross per 24 hour  Intake 930.75 ml  Output    500 ml  Net 430.75 ml     CBC:    Component Value Date/Time   WBC 7.0 05/23/2011 0700   HGB 9.6* 05/23/2011 0700   HCT 28.5* 05/23/2011 0700   PLT 118* 05/23/2011 0700   MCV 96.9 05/23/2011 0700   NEUTROABS 5.1 05/23/2011 0700   LYMPHSABS 0.9 05/23/2011 0700   MONOABS 0.8 05/23/2011 0700   EOSABS 0.2 05/23/2011 0700   BASOSABS 0.0 05/23/2011 0700    Basic Metabolic Panel:    Component Value Date/Time   NA 142 05/21/2011 0630   K 4.3 05/21/2011 0630   CL 110 05/21/2011 0630   CO2 25 05/21/2011 0630   BUN 21 05/21/2011 0630   CREATININE 1.03 05/21/2011 0630   GLUCOSE 112* 05/21/2011 0630   CALCIUM 8.6 05/21/2011 0630    Hospital Course:  Donna Jones is a 77 year old Caucasian female who looks much younger than stated age. She presented to the emergency department after a fall at home and found to have left hip fracture. Denies chest pain, shortness of breath, no abdominal or urinary concerns.  She was seen by orthopedic surgery and it was determined that her hip repair would require surgery. Orthopedic surgery planned an intramedullary hip screw fixation I discussed the risks and benefits with her nephew  Donna Jones and obtained consent for the procedure. Donna Jones is on Coumadin which had to be reversed with FFP prior to surgery. She successfully underwent surgery and worked with physical therapy and occupational therapy afterwords is in the following days it was determined that she would benefit from skilled nursing facility rehabilitation. A nursing facility search was undertaken and the patient and her family have picked up facility. She is expected to be there for short-term rehabilitation after which she will live with her niece in [place].  Shortly after admission the patient was hypotensive her blood pressure medications were held her Lasix was decreased and she was given gentle IV fluids. Her blood pressure responded well and has been stable since.   Patient and family report the patient  does have chronic hypotension for several years with blood pressures systolic running between the high 80s to 90s. The patient has been asymptomatic with systolic blood pressures in this range. Presently the patient has no complaints of orthostasis with movement.   A-fib the patient remains in chronic atrial for relation this is rate controlled. She is on chronic Coumadin which is now therapeutic.   Anemia.  The patient developed mild postop anemia. At the time of admission to the hospital she had a hemoglobin of 10.7. It trended down to 8.1 post operatively. The patient shows no active signs of bleeding. It was felt prudent to transfuse one pack of packed red blood cells. This was done on November 14. The patient's hemoglobin responded accordingly and today her hemoglobin is 9.5. The patient will need to be monitored ongoing for recovery from her anemia. Particularly because she is on chronic Coumadin. She may need ongoing iron therapy.  Congestive heart failure. The patient's past medical history included congestive heart failure but did not designate whether it was systolic or diastolic. However  there have been no issues with congestive heart failure during this hospitalization the patient will be discharged on a decreased his Lasix. 40 mg daily   Time spent on Discharge: 60 minutes.  SignedStephani Police 05/23/2011, 1:23 PM

## 2011-05-23 NOTE — Progress Notes (Signed)
Pt.had a pause on heart monitor of 1.54  Sec .V/S taken;hr=84;b/p=114/74;pt.is asymptomatic.Placed a call to DR.Ardyth Harps.

## 2011-05-23 NOTE — Discharge Summary (Addendum)
Patient seen and examined. Assessment and plan discussed with a physician assistant Chyrel Masson appear hospital course discharge medications post discharge followup and disposition although discussed with patient and his you're. I agree with Ms. Priscella Mann note.

## 2011-06-11 NOTE — Discharge Summary (Signed)
Patient seen and examined and discussed with PA Algis Downs. I agree with assessment and plan.

## 2012-08-26 IMAGING — RF DG FEMUR 2V*L*
1 series · 4 of 4 positions shown · non-contrast
Comparison: Radiographs dated 05/18/2011

CLINICAL DATA: Left hip fracture.

LEFT FEMUR - 2 VIEW

[Series 1: run · 4 of 4 slices shown]
[im 1/4]
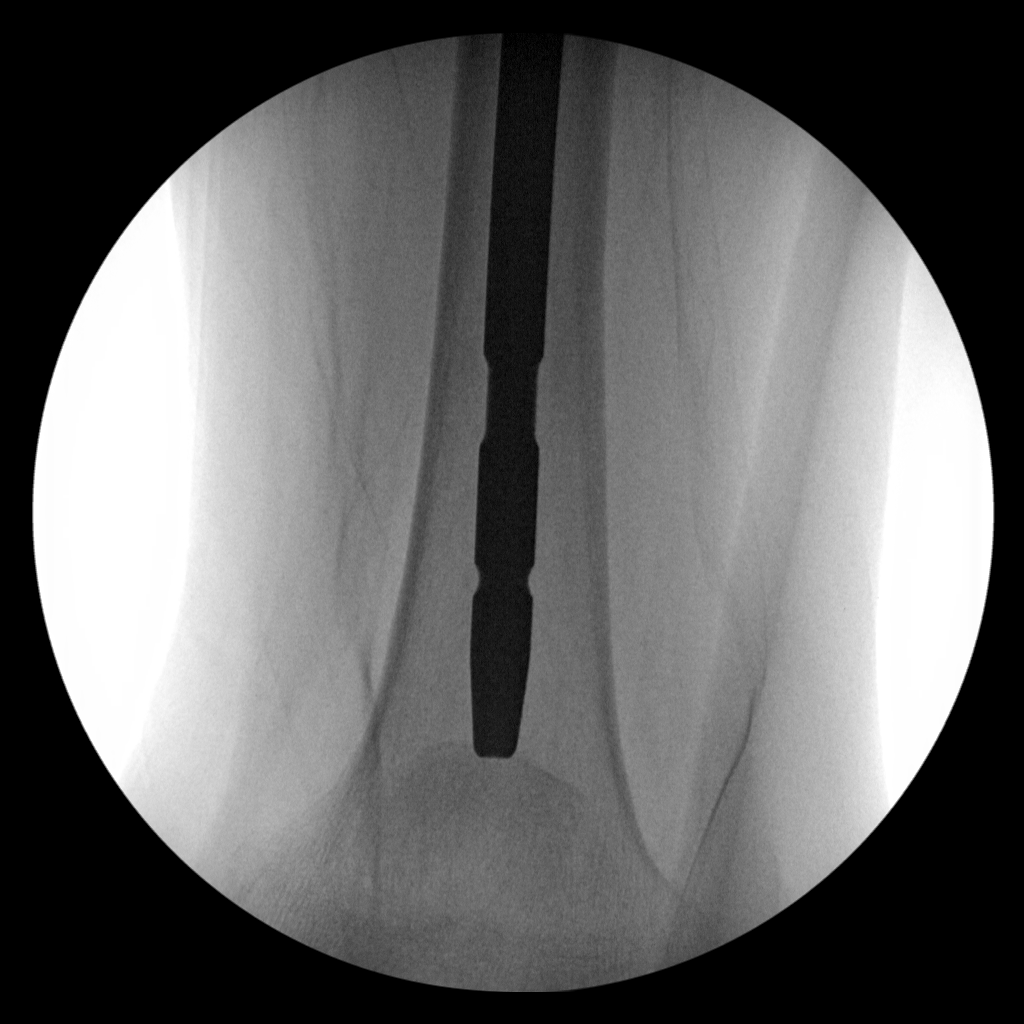
[im 2/4]
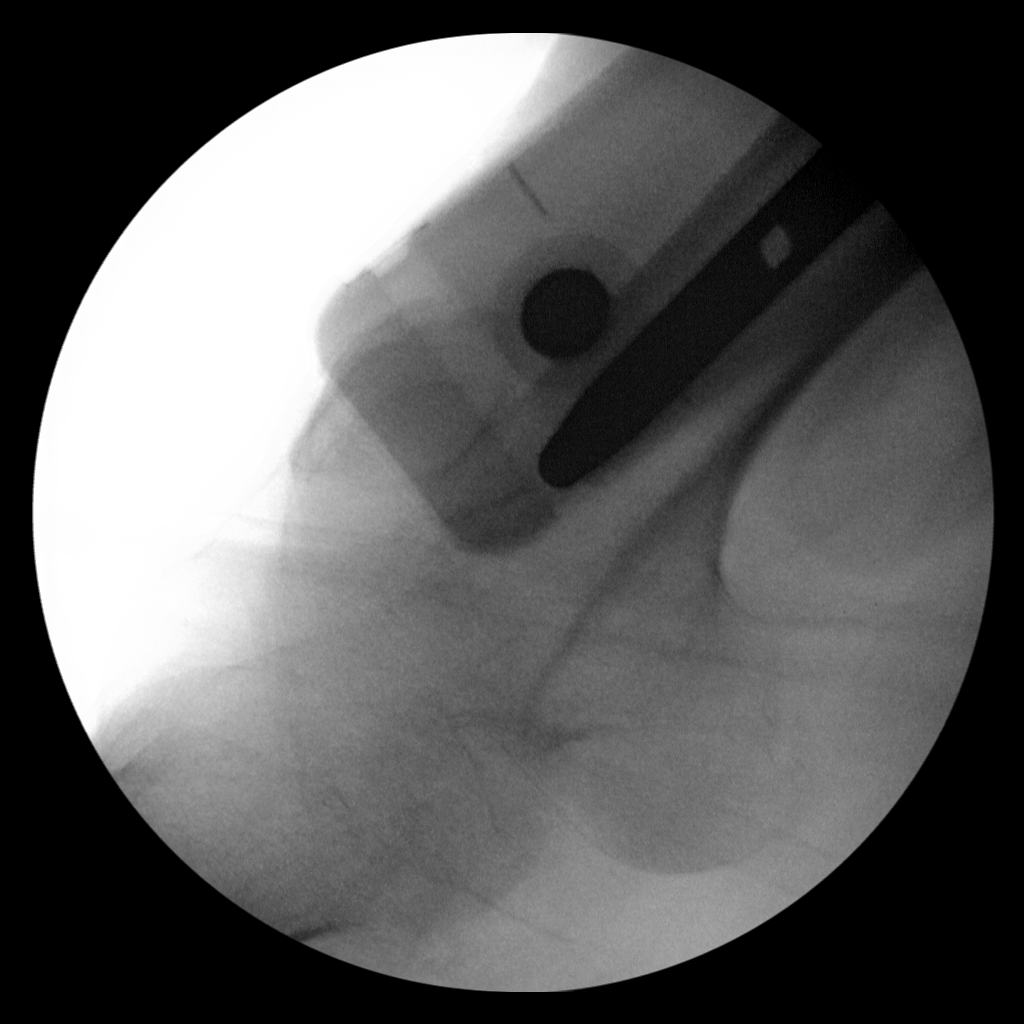
[im 3/4]
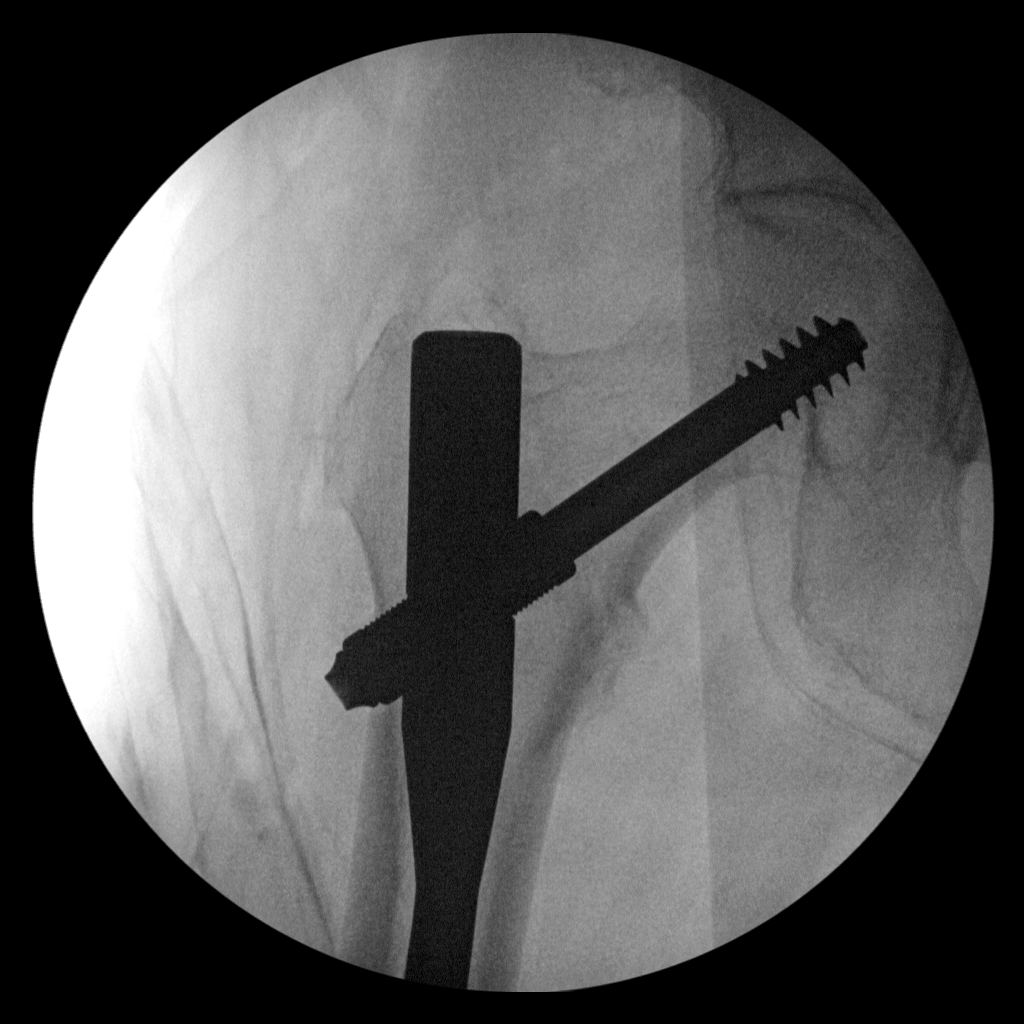
[im 4/4]
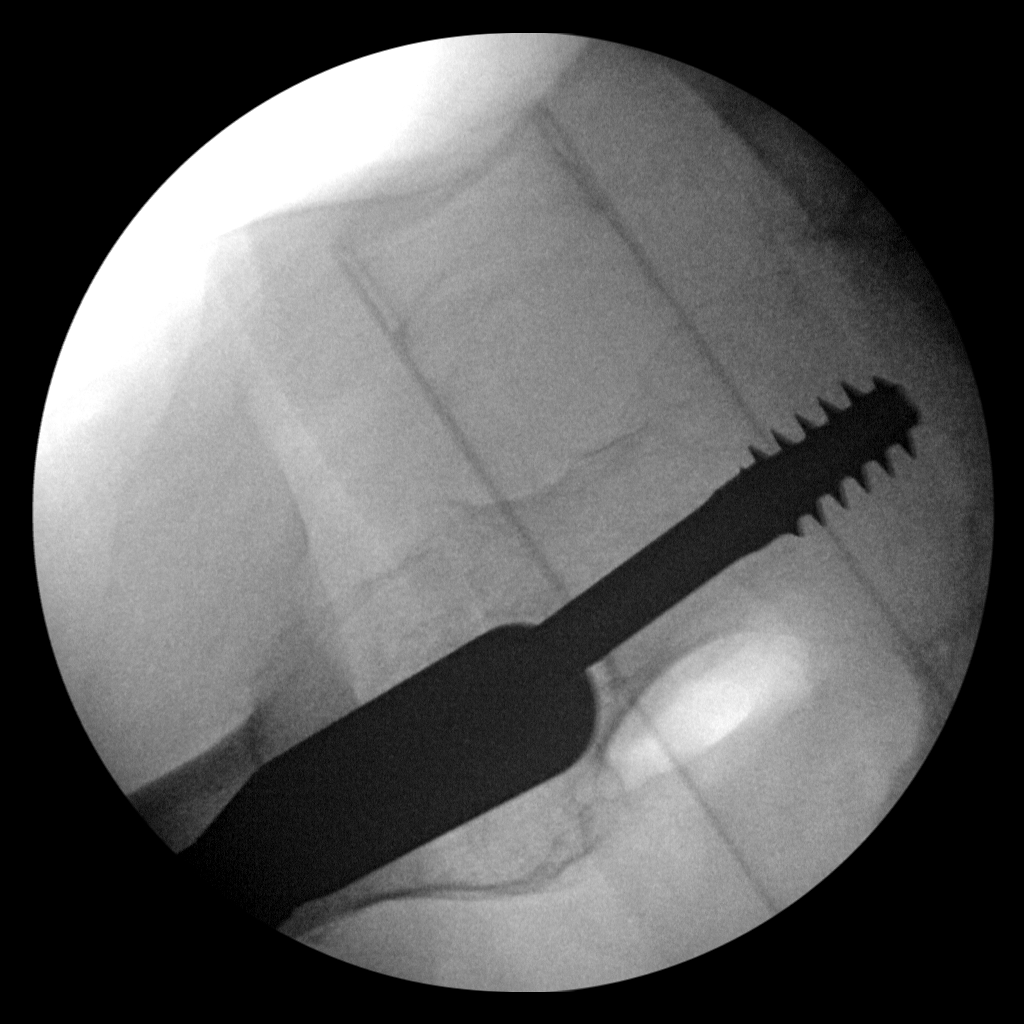

[4 of 4 positions shown; findings below may reference images not displayed]

FINDINGS: AP and lateral C-arm images demonstrate insertion of an
intramedullary rod in the femoral shaft and a compression screw
across the intertrochanteric fracture of the proximal left femur.
Position of the fragments is anatomic.
IMPRESSION: Satisfactory appearance of the left hip after open reduction and
internal fixation of intertrochanteric fracture.

## 2014-01-19 ENCOUNTER — Telehealth: Payer: Self-pay

## 2014-01-19 NOTE — Telephone Encounter (Signed)
Patient died @ [facility] per Obituary °

## 2014-02-05 DEATH — deceased
